# Patient Record
Sex: Female | Born: 1970 | Race: White | Hispanic: No | Smoking: Never smoker
Health system: Southern US, Community
[De-identification: ages and names within clinical notes are randomized; demographics above are authoritative.]

## PROBLEM LIST (undated history)

## (undated) DIAGNOSIS — E119 Type 2 diabetes mellitus without complications: Secondary | ICD-10-CM

## (undated) DIAGNOSIS — E039 Hypothyroidism, unspecified: Secondary | ICD-10-CM

## (undated) HISTORY — DX: Hypothyroidism, unspecified: E03.9

## (undated) HISTORY — PX: TOTAL ABDOMINAL HYSTERECTOMY W/ BILATERAL SALPINGOOPHORECTOMY: SHX83

## (undated) HISTORY — PX: OTHER SURGICAL HISTORY: SHX169

## (undated) HISTORY — DX: Type 2 diabetes mellitus without complications: E11.9

## (undated) HISTORY — PX: TONSILLECTOMY: SUR1361

## (undated) HISTORY — PX: LARYNX SURGERY: SHX692

## (undated) HISTORY — PX: CHOLECYSTECTOMY: SHX55

---

## 1997-09-28 ENCOUNTER — Inpatient Hospital Stay (HOSPITAL_COMMUNITY): Admission: AD | Admit: 1997-09-28 | Discharge: 1997-09-28 | Payer: Self-pay | Admitting: Obstetrics and Gynecology

## 1997-10-11 ENCOUNTER — Inpatient Hospital Stay (HOSPITAL_COMMUNITY): Admission: AD | Admit: 1997-10-11 | Discharge: 1997-10-14 | Payer: Self-pay | Admitting: Obstetrics and Gynecology

## 1998-09-30 ENCOUNTER — Encounter: Admission: RE | Admit: 1998-09-30 | Discharge: 1998-10-24 | Payer: Self-pay | Admitting: *Deleted

## 1999-05-26 ENCOUNTER — Inpatient Hospital Stay (HOSPITAL_COMMUNITY): Admission: AD | Admit: 1999-05-26 | Discharge: 1999-05-26 | Payer: Self-pay | Admitting: *Deleted

## 1999-05-27 ENCOUNTER — Encounter: Payer: Self-pay | Admitting: *Deleted

## 2000-08-06 ENCOUNTER — Ambulatory Visit (HOSPITAL_COMMUNITY): Admission: RE | Admit: 2000-08-06 | Discharge: 2000-08-06 | Payer: Self-pay | Admitting: Obstetrics and Gynecology

## 2001-10-22 ENCOUNTER — Emergency Department (HOSPITAL_COMMUNITY): Admission: EM | Admit: 2001-10-22 | Discharge: 2001-10-22 | Payer: Self-pay | Admitting: Internal Medicine

## 2002-01-19 ENCOUNTER — Ambulatory Visit (HOSPITAL_COMMUNITY): Admission: RE | Admit: 2002-01-19 | Discharge: 2002-01-19 | Payer: Self-pay | Admitting: Family Medicine

## 2002-01-19 ENCOUNTER — Encounter: Payer: Self-pay | Admitting: Family Medicine

## 2002-08-10 ENCOUNTER — Inpatient Hospital Stay (HOSPITAL_COMMUNITY): Admission: RE | Admit: 2002-08-10 | Discharge: 2002-08-13 | Payer: Self-pay | Admitting: Obstetrics & Gynecology

## 2004-08-18 ENCOUNTER — Ambulatory Visit (HOSPITAL_COMMUNITY): Admission: RE | Admit: 2004-08-18 | Discharge: 2004-08-18 | Payer: Self-pay | Admitting: Family Medicine

## 2004-08-21 ENCOUNTER — Encounter (HOSPITAL_COMMUNITY): Admission: RE | Admit: 2004-08-21 | Discharge: 2004-09-20 | Payer: Self-pay | Admitting: Family Medicine

## 2006-02-26 ENCOUNTER — Ambulatory Visit: Payer: Self-pay | Admitting: Internal Medicine

## 2007-03-06 ENCOUNTER — Emergency Department (HOSPITAL_COMMUNITY): Admission: EM | Admit: 2007-03-06 | Discharge: 2007-03-06 | Payer: Self-pay | Admitting: Family Medicine

## 2007-04-03 ENCOUNTER — Emergency Department (HOSPITAL_COMMUNITY): Admission: EM | Admit: 2007-04-03 | Discharge: 2007-04-03 | Payer: Self-pay | Admitting: Family Medicine

## 2007-10-06 ENCOUNTER — Encounter (INDEPENDENT_AMBULATORY_CARE_PROVIDER_SITE_OTHER): Payer: Self-pay | Admitting: Internal Medicine

## 2007-10-06 ENCOUNTER — Inpatient Hospital Stay (HOSPITAL_COMMUNITY): Admission: EM | Admit: 2007-10-06 | Discharge: 2007-10-07 | Payer: Self-pay | Admitting: Emergency Medicine

## 2007-10-20 ENCOUNTER — Encounter (HOSPITAL_COMMUNITY): Admission: RE | Admit: 2007-10-20 | Discharge: 2007-11-19 | Payer: Self-pay | Admitting: Internal Medicine

## 2007-12-23 ENCOUNTER — Ambulatory Visit (HOSPITAL_COMMUNITY): Admission: RE | Admit: 2007-12-23 | Discharge: 2007-12-24 | Payer: Self-pay | Admitting: *Deleted

## 2007-12-23 ENCOUNTER — Encounter (INDEPENDENT_AMBULATORY_CARE_PROVIDER_SITE_OTHER): Payer: Self-pay | Admitting: *Deleted

## 2007-12-27 ENCOUNTER — Inpatient Hospital Stay (HOSPITAL_COMMUNITY): Admission: EM | Admit: 2007-12-27 | Discharge: 2007-12-29 | Payer: Self-pay | Admitting: Emergency Medicine

## 2008-02-09 ENCOUNTER — Ambulatory Visit (HOSPITAL_COMMUNITY): Admission: RE | Admit: 2008-02-09 | Discharge: 2008-02-09 | Payer: Self-pay | Admitting: Gastroenterology

## 2008-02-14 ENCOUNTER — Ambulatory Visit (HOSPITAL_COMMUNITY): Admission: RE | Admit: 2008-02-14 | Discharge: 2008-02-14 | Payer: Self-pay | Admitting: Gastroenterology

## 2009-01-10 ENCOUNTER — Ambulatory Visit (HOSPITAL_COMMUNITY): Admission: RE | Admit: 2009-01-10 | Discharge: 2009-01-10 | Payer: Self-pay | Admitting: Internal Medicine

## 2009-01-14 ENCOUNTER — Encounter (INDEPENDENT_AMBULATORY_CARE_PROVIDER_SITE_OTHER): Payer: Self-pay | Admitting: Otolaryngology

## 2009-01-14 ENCOUNTER — Inpatient Hospital Stay (HOSPITAL_COMMUNITY): Admission: RE | Admit: 2009-01-14 | Discharge: 2009-01-24 | Payer: Self-pay | Admitting: Otolaryngology

## 2009-01-21 ENCOUNTER — Encounter (INDEPENDENT_AMBULATORY_CARE_PROVIDER_SITE_OTHER): Payer: Self-pay | Admitting: Otolaryngology

## 2009-02-08 ENCOUNTER — Ambulatory Visit: Payer: Self-pay | Admitting: Pulmonary Disease

## 2009-02-08 ENCOUNTER — Encounter (INDEPENDENT_AMBULATORY_CARE_PROVIDER_SITE_OTHER): Payer: Self-pay | Admitting: Otolaryngology

## 2009-02-08 ENCOUNTER — Inpatient Hospital Stay (HOSPITAL_COMMUNITY): Admission: RE | Admit: 2009-02-08 | Discharge: 2009-02-10 | Payer: Self-pay | Admitting: Otolaryngology

## 2009-06-08 DIAGNOSIS — E119 Type 2 diabetes mellitus without complications: Secondary | ICD-10-CM

## 2009-06-08 HISTORY — DX: Type 2 diabetes mellitus without complications: E11.9

## 2009-07-18 ENCOUNTER — Emergency Department (HOSPITAL_COMMUNITY): Admission: EM | Admit: 2009-07-18 | Discharge: 2009-07-18 | Payer: Self-pay | Admitting: Emergency Medicine

## 2009-07-28 IMAGING — NM NM LIVER FUNCTION STUDY
1 series · 6 of 6 positions shown · non-contrast
Comparison: None

CLINICAL DATA: Severe left-sided abdominal pain and nausea status
post cholecystectomy.  Evaluate for biliary leak.

NUCLEAR MEDICINE HEPATOBILIARY IMAGING
TECHNIQUE: Sequential images of the abdomen were obtained [DATE] minutes following intravenous administration of
radiopharmaceutical. Additional delayed images were also obtained
with a wide field of view camera.
Radiopharmaceutical:  P.PmNi Pc-PPm Choletec

[hida · 2.40mm/px · 6 of 10 frames shown]
[frame 1/10]
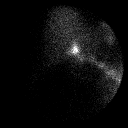
[frame 3/10]
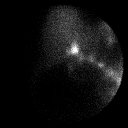
[frame 5/10]
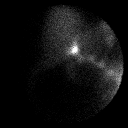
[frame 6/10]
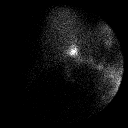
[frame 8/10]
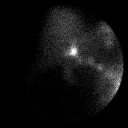
[frame 10/10]
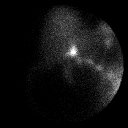

[6 of 6 positions shown; findings below may reference images not displayed]

FINDINGS: Prompt radiopharmaceutical uptake by the liver is seen.
There is also prompt biliary excretion of activity.

Excreted biliary activity does not follow the course of the common
bile duct into the duodenum.  The biliary activity traverses from
the porta hepatis into the left abdomen and pelvis, where it
accumulates in the perisplenic space and left pericolic gutter.
This is consistent with a large postop biliary leak.
IMPRESSION: Large postop bile leak, with all bile accumulating within the left
perisplenic space and left pericolic gutter.

These findings were discussed with Dr. Jusban by telephone at 5394
hours.  The patient was returned to the emergency room.

## 2009-10-17 ENCOUNTER — Ambulatory Visit (HOSPITAL_COMMUNITY): Admission: RE | Admit: 2009-10-17 | Discharge: 2009-10-17 | Payer: Self-pay | Admitting: Internal Medicine

## 2009-10-30 ENCOUNTER — Encounter (INDEPENDENT_AMBULATORY_CARE_PROVIDER_SITE_OTHER): Payer: Self-pay | Admitting: *Deleted

## 2010-03-28 ENCOUNTER — Emergency Department (HOSPITAL_COMMUNITY): Admission: EM | Admit: 2010-03-28 | Discharge: 2010-03-28 | Payer: Self-pay | Admitting: Emergency Medicine

## 2010-04-12 ENCOUNTER — Emergency Department (HOSPITAL_COMMUNITY): Admission: EM | Admit: 2010-04-12 | Discharge: 2010-04-12 | Payer: Self-pay | Admitting: Emergency Medicine

## 2010-06-06 ENCOUNTER — Emergency Department (HOSPITAL_COMMUNITY)
Admission: EM | Admit: 2010-06-06 | Discharge: 2010-06-06 | Payer: Self-pay | Source: Home / Self Care | Admitting: Emergency Medicine

## 2010-06-29 ENCOUNTER — Encounter: Payer: Self-pay | Admitting: Family Medicine

## 2010-07-08 NOTE — Letter (Signed)
Summary: Unable to Reach, Consult Scheduled  [facility]  [address]   [place], [place] [address]   Phone: [phone]  Fax: [phone]    10/30/2009  [facility] [address] [place], [place]  [address] 1971/04/30   Dear Ms. Yaney,   We have been unable to reach you by phone.  Please contact our office with an updated phone number.  At the recommendation of DR [facility] we have been asked to schedule you a consult with DR Jena Gauss for ABDOMINAL PAIN .  Please call our office at [phone].     Thank you,    Diana Eves  [facility] R. Roetta Sessions, M.D.    Jonette Eva, M.D. Lorenza Burton, FNP-BC    Tana Coast, PA-C Phone: [phone]    Fax: [phone]

## 2010-08-12 IMAGING — CT CT NECK W/ CM
3 of 4 series · 13 of 33 positions shown, 16 images · IV contrast (Omnipaque 300)
Comparison: None.

CLINICAL DATA: Dysphasia.  Goiter.

CT NECK WITH CONTRAST
TECHNIQUE: Multidetector CT imaging of the neck was performed with
intravenous contrast.
Contrast: 100 ml Omnipaque-MYY.

[Series 2: soft tissue neck 3.0 b31s · axial · 0.32mm/px · z∈[+988,+1136]mm · 5 of 69 slices shown, 7 images]
[im 13/69  soft-tissue]
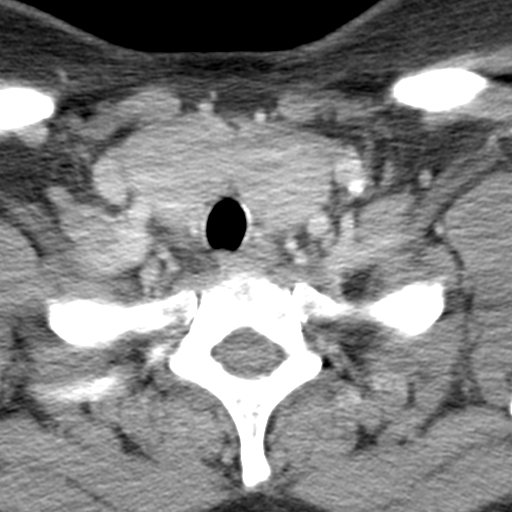
[im 13/69  bone]
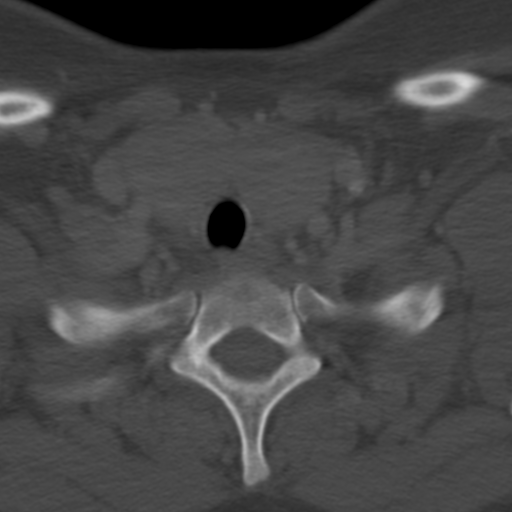
[im 25/69  bone]
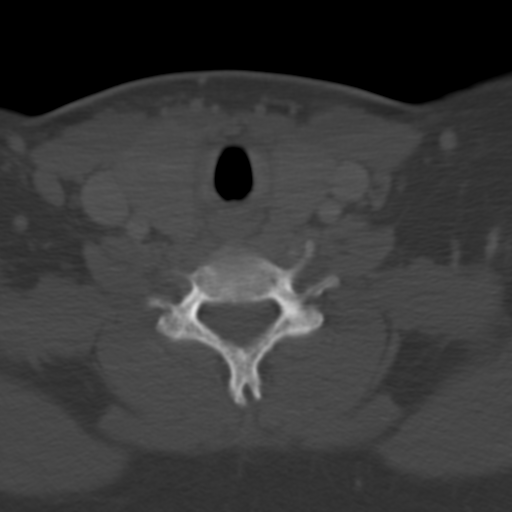
[im 38/69  bone]
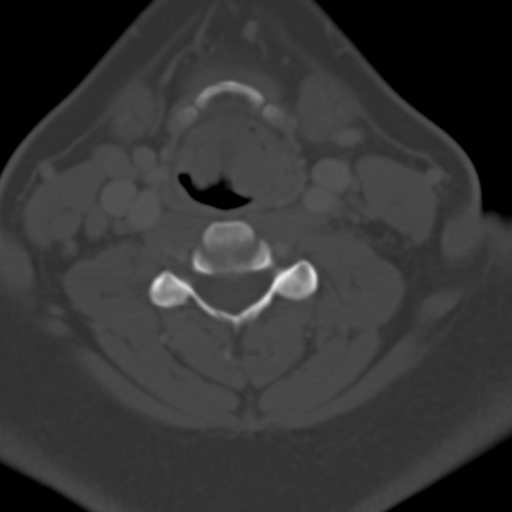
[im 50/69  bone]
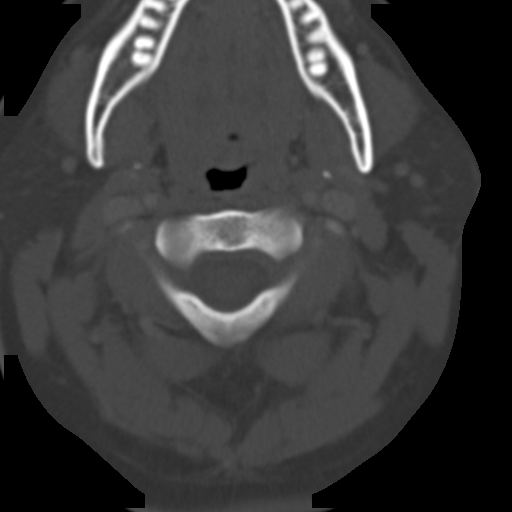
[im 62/69  soft-tissue]
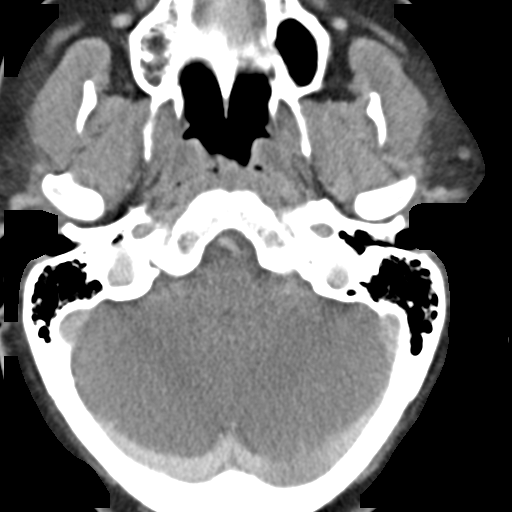
[im 62/69  bone]
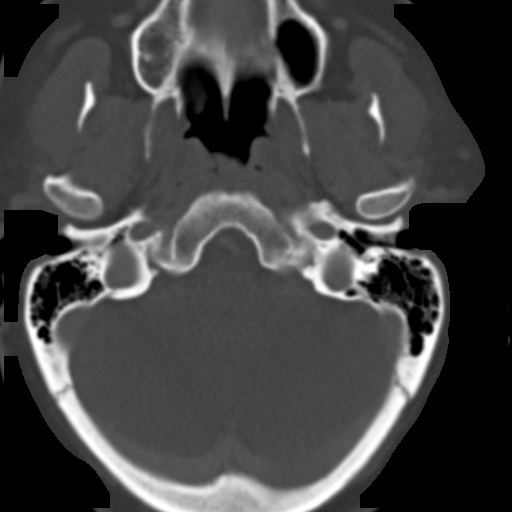

[Series 4: neck 3.0 soft tissue coronal · coronal · 0.30mm/px · 3 of 48 slices shown]
[im 10/48  bone]
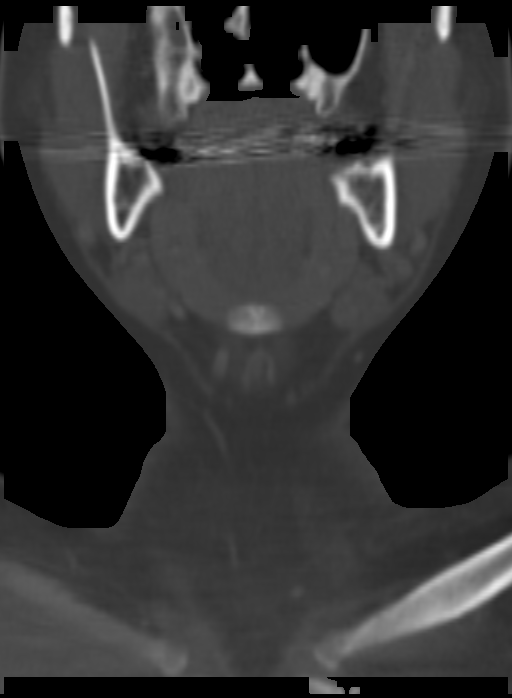
[im 19/48  bone]
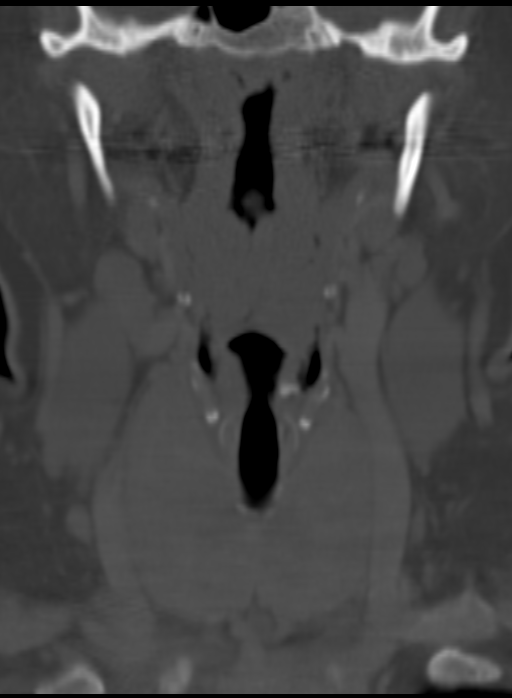
[im 29/48  bone]
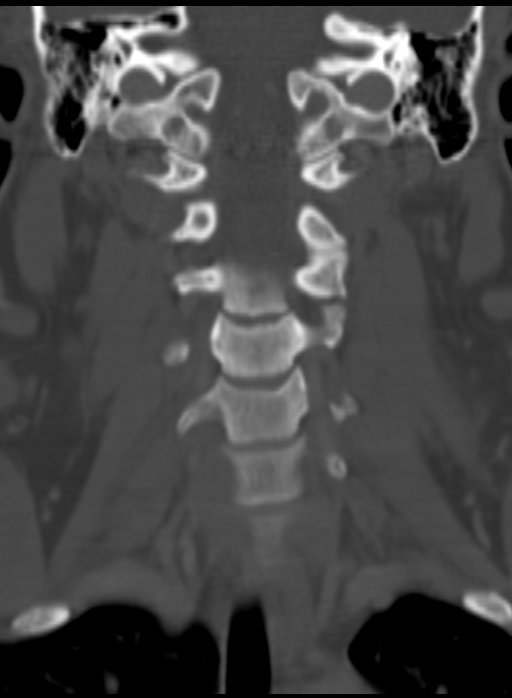

[Series 5: neck 3.0 soft tissue sag · sagittal · 0.33mm/px · 5 of 53 slices shown, 6 images]
[im 18/53  bone]
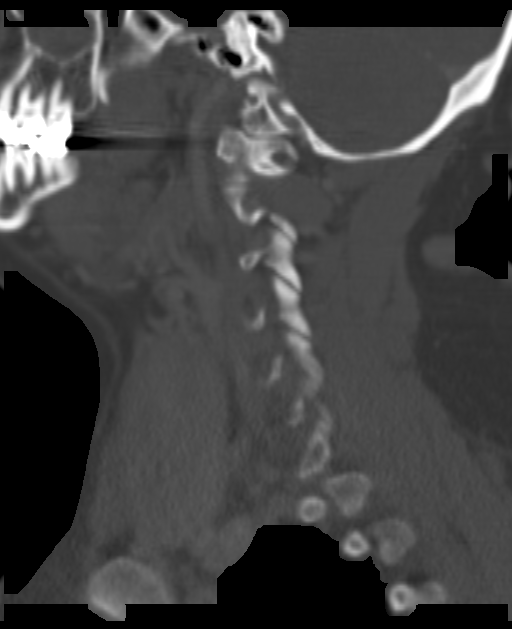
[im 22/53  bone]
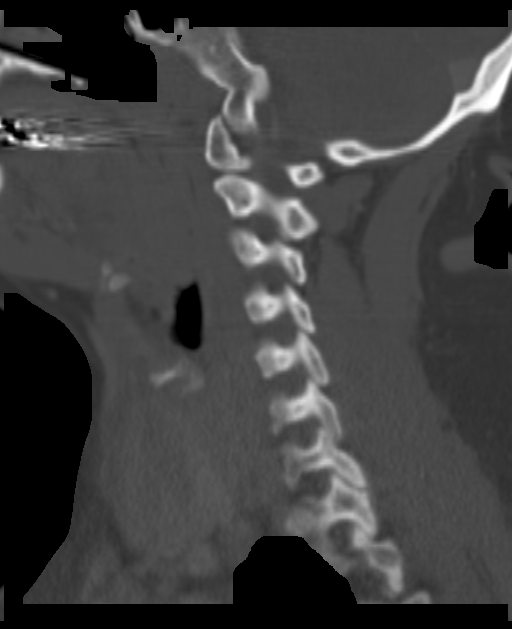
[im 27/53  soft-tissue]
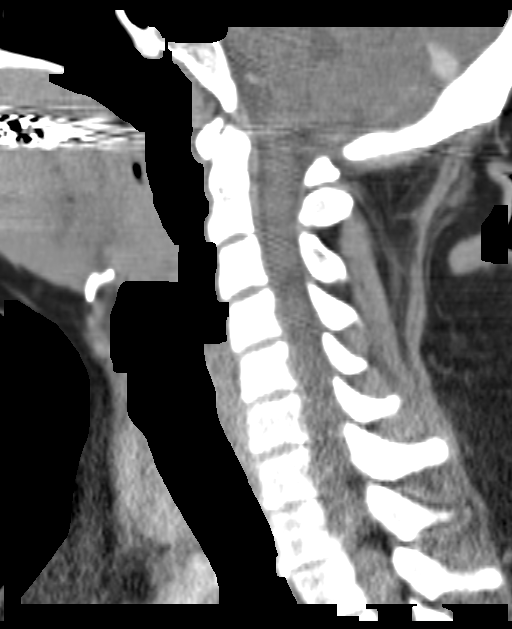
[im 27/53  bone]
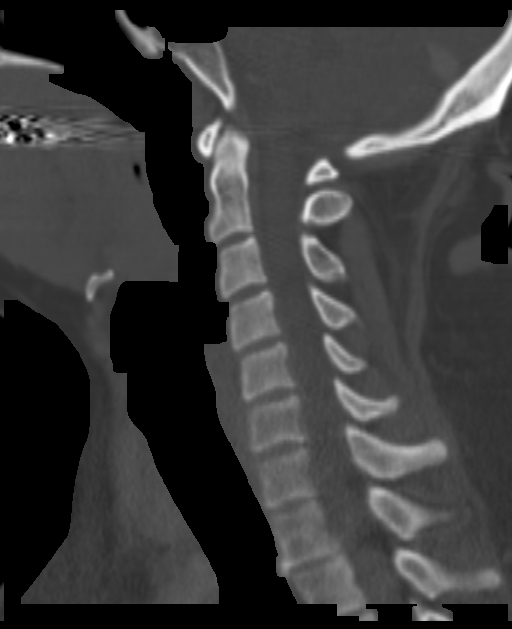
[im 31/53  bone]
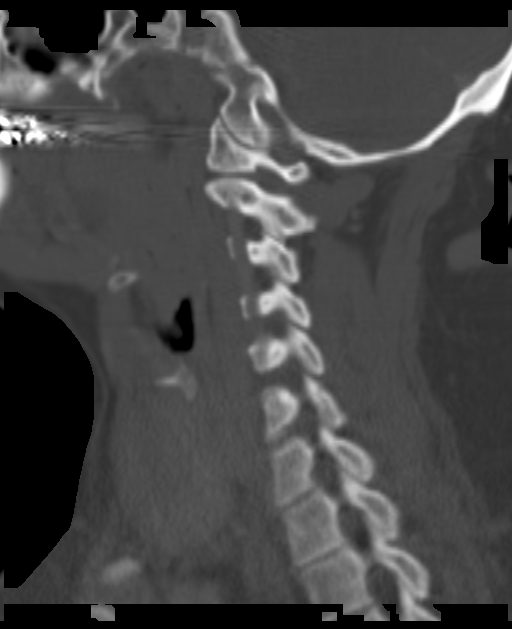
[im 35/53  bone]
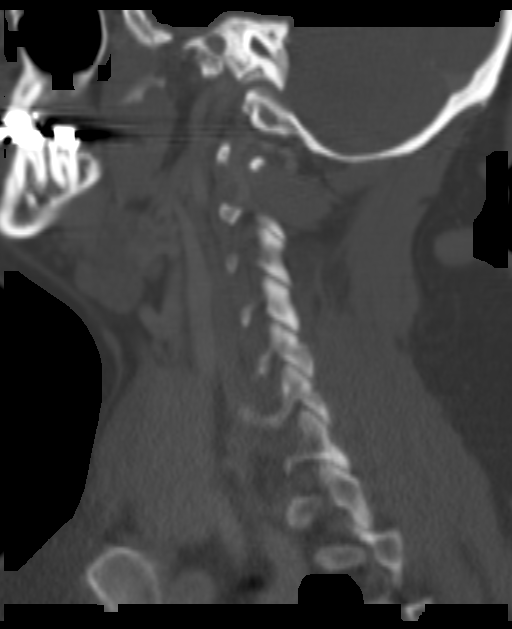

[13 of 33 positions shown; findings below may reference images not displayed]

FINDINGS: Mass in the vallecula region.  It is possible this
represents enlargement of the tonsillar lymphoid tissue (lymphoma)
although primary mass with extension into the superior aspect of
the aryepiglottic folds (greater on the left) is also a
consideration.  This causes narrowing of the airway.

Scattered increased number of normal size to slightly enlarged
lymph nodes throughout the neck.  This includes lymph node
posterior to the submandibular gland bilaterally measuring 1.9 x
0.9 cm on the left spanning over 1.9 cm in length and 1.8 x 1.2 cm
on the right spanning over 2.3 cm length.  Additionally, right
posterior triangle lymph node appears slightly rounded measuring
1.1 x 1.3 cm and spanning over 2.1 cm length.  Lower level IV lymph
nodes, larger on the left, measure up to 1.5 x 0.9 cm.

Slight fullness posterior superior nasopharyngeal region slightly
larger on the right.  This may represent coaptation membranes
although direct visualization would be necessary to exclude mucosal
abnormality.

Opacification right maxillary sinus.

Enlarged slightly heterogeneous thyroid gland can be further
evaluated with ultrasound. This causes minimal narrowing of the
airway.
IMPRESSION: Tongue base/vallecular region mass and surrounding slightly
prominent sized lymph nodes as described above.  This causes
narrowing of the airway.  Malignancy is a possibility.

Enlarged thyroid gland, ultrasound can be obtained for further
delineation.

Opacification right mastoid air cells.

Critical test results telephoned to Dr. Ami Ne at the time of
interpretation on 01/10/2009 at [DATE] a.m.

## 2010-09-07 ENCOUNTER — Emergency Department (HOSPITAL_COMMUNITY)
Admission: EM | Admit: 2010-09-07 | Discharge: 2010-09-08 | Disposition: A | Discharge status: Home / Self Care | Payer: Self-pay | Attending: Emergency Medicine | Admitting: Emergency Medicine

## 2010-09-07 ENCOUNTER — Inpatient Hospital Stay (INDEPENDENT_AMBULATORY_CARE_PROVIDER_SITE_OTHER)
Admission: RE | Admit: 2010-09-07 | Discharge: 2010-09-07 | Disposition: A | Discharge status: Home / Self Care | Payer: Self-pay | Source: Ambulatory Visit | Attending: Family Medicine | Admitting: Family Medicine

## 2010-09-07 DIAGNOSIS — R631 Polydipsia: Secondary | ICD-10-CM | POA: Insufficient documentation

## 2010-09-07 DIAGNOSIS — R3589 Other polyuria: Secondary | ICD-10-CM | POA: Insufficient documentation

## 2010-09-07 DIAGNOSIS — R634 Abnormal weight loss: Secondary | ICD-10-CM | POA: Insufficient documentation

## 2010-09-07 DIAGNOSIS — E669 Obesity, unspecified: Secondary | ICD-10-CM | POA: Insufficient documentation

## 2010-09-07 DIAGNOSIS — R358 Other polyuria: Secondary | ICD-10-CM | POA: Insufficient documentation

## 2010-09-07 DIAGNOSIS — R000 Tachycardia, unspecified: Secondary | ICD-10-CM | POA: Insufficient documentation

## 2010-09-07 DIAGNOSIS — E119 Type 2 diabetes mellitus without complications: Secondary | ICD-10-CM | POA: Insufficient documentation

## 2010-09-07 DIAGNOSIS — E039 Hypothyroidism, unspecified: Secondary | ICD-10-CM | POA: Insufficient documentation

## 2010-09-07 DIAGNOSIS — R5381 Other malaise: Secondary | ICD-10-CM | POA: Insufficient documentation

## 2010-09-07 LAB — POCT URINALYSIS DIP (DEVICE)
Nitrite: NEGATIVE
Specific Gravity, Urine: 1.01 (ref 1.005–1.030)

## 2010-09-07 LAB — POCT I-STAT, CHEM 8
BUN: 9 mg/dL (ref 6–23)
Chloride: 100 mEq/L (ref 96–112)
Glucose, Bld: 456 mg/dL — ABNORMAL HIGH (ref 70–99)
HCT: 48 % — ABNORMAL HIGH (ref 36.0–46.0)
Hemoglobin: 16.3 g/dL — ABNORMAL HIGH (ref 12.0–15.0)
Potassium: 4 mEq/L (ref 3.5–5.1)
Sodium: 136 mEq/L (ref 135–145)
TCO2: 25 mmol/L (ref 0–100)

## 2010-09-08 LAB — URINE MICROSCOPIC-ADD ON

## 2010-09-08 LAB — URINALYSIS, ROUTINE W REFLEX MICROSCOPIC
Bilirubin Urine: NEGATIVE
Glucose, UA: >1000 mg/dL — AB
Hgb urine dipstick: NEGATIVE
Ketones, ur: 15 mg/dL — AB
Specific Gravity, Urine: >1.046 — ABNORMAL HIGH (ref 1.005–1.030)

## 2010-09-08 LAB — BASIC METABOLIC PANEL
BUN: 8 mg/dL (ref 6–23)
CO2: 24 mEq/L (ref 19–32)
Calcium: 9.1 mg/dL (ref 8.4–10.5)
Chloride: 97 mEq/L (ref 96–112)
Creatinine, Ser: 0.8 mg/dL (ref 0.4–1.2)
GFR calc non Af Amer: >60 mL/min (ref >60)
Glucose, Bld: 361 mg/dL — ABNORMAL HIGH (ref 70–99)
Potassium: 3.5 mEq/L (ref 3.5–5.1)

## 2010-09-08 LAB — DIFFERENTIAL
Basophils Absolute: 0.1 10*3/uL (ref 0.0–0.1)
Monocytes Absolute: 0.9 10*3/uL (ref 0.1–1.0)
Neutro Abs: 4.5 10*3/uL (ref 1.7–7.7)

## 2010-09-08 LAB — CBC
HCT: 43.8 % (ref 36.0–46.0)
Platelets: 256 10*3/uL (ref 150–400)
RBC: 5.46 MIL/uL — ABNORMAL HIGH (ref 3.87–5.11)
RDW: 13 % (ref 11.5–15.5)

## 2010-09-08 LAB — GLUCOSE, CAPILLARY: Glucose-Capillary: 294 mg/dL — ABNORMAL HIGH (ref 70–99)

## 2010-09-12 LAB — POCT I-STAT 3, ART BLOOD GAS (G3+)
TCO2: 27 mmol/L (ref 0–100)
pCO2 arterial: 41.7 mmHg (ref 35.0–45.0)
pH, Arterial: 7.404 — ABNORMAL HIGH (ref 7.350–7.400)

## 2010-09-12 LAB — CBC
HCT: 29.6 % — ABNORMAL LOW (ref 36.0–46.0)
HCT: 33.5 % — ABNORMAL LOW (ref 36.0–46.0)
Hemoglobin: 11.5 g/dL — ABNORMAL LOW (ref 12.0–15.0)
Hemoglobin: 12.7 g/dL (ref 12.0–15.0)
MCV: 84.5 fL (ref 78.0–100.0)
MCV: 84.6 fL (ref 78.0–100.0)
Platelets: 175 10*3/uL (ref 150–400)
Platelets: 240 10*3/uL (ref 150–400)
RBC: 4.45 MIL/uL (ref 3.87–5.11)
RDW: 13.2 % (ref 11.5–15.5)
RDW: 13.3 % (ref 11.5–15.5)
RDW: 13.5 % (ref 11.5–15.5)
WBC: 11.8 10*3/uL — ABNORMAL HIGH (ref 4.0–10.5)
WBC: 9.3 10*3/uL (ref 4.0–10.5)

## 2010-09-12 LAB — BASIC METABOLIC PANEL
BUN: 9 mg/dL (ref 6–23)
CO2: 29 mEq/L (ref 19–32)
Chloride: 106 mEq/L (ref 96–112)
GFR calc non Af Amer: >60 mL/min (ref >60)
Glucose, Bld: 133 mg/dL — ABNORMAL HIGH (ref 70–99)
Glucose, Bld: 95 mg/dL (ref 70–99)
Potassium: 3.7 mEq/L (ref 3.5–5.1)
Potassium: 4.3 mEq/L (ref 3.5–5.1)
Sodium: 137 mEq/L (ref 135–145)

## 2010-09-12 LAB — MRSA PCR SCREENING: MRSA by PCR: NEGATIVE

## 2010-09-13 LAB — CBC
MCV: 84.2 fL (ref 78.0–100.0)
Platelets: 219 10*3/uL (ref 150–400)
RDW: 13.1 % (ref 11.5–15.5)
WBC: 7.6 10*3/uL (ref 4.0–10.5)

## 2010-09-13 LAB — GLUCOSE, CAPILLARY: Glucose-Capillary: 63 mg/dL — ABNORMAL LOW (ref 70–99)

## 2010-09-13 LAB — BASIC METABOLIC PANEL
BUN: 9 mg/dL (ref 6–23)
Creatinine, Ser: 0.74 mg/dL (ref 0.4–1.2)
GFR calc non Af Amer: >60 mL/min (ref >60)

## 2010-09-13 LAB — ANTI-THYROGLOBULIN ANTIBODY: Thyroglobulin Ab: 396.2 U/mL — ABNORMAL HIGH (ref 0.0–60.0)

## 2010-10-21 NOTE — H&P (Signed)
[name], HARDIE           ACCOUNT NO.:  [id number]   MEDICAL RECORD NO.:  [id number]          PATIENT TYPE:  INP   LOCATION:  4733                         FACILITY:  [facility]   PHYSICIAN:  Hind I Elsaid, MD      DATE OF BIRTH:  [birth year]   DATE OF ADMISSION:  10/06/2007  DATE OF DISCHARGE:                              HISTORY & PHYSICAL   CHIEF COMPLAINT:  Chest pain.   HISTORY OF PRESENT ILLNESS:  This is a 40 year old female with a history  of hypothyroidism, obesity, admitted to the hospital while she was  sitting on the computer.  She suddenly started feeling sharp chest pain.  The pain was sharp in nature 7/10, nonradiating.  It is mainly  retrosternal, associated with sweating and palpitation at that time.  The pain did never go away.  The patient is still complaining of chest  pain, but the pain is less and severity at this time is 5/10.  The pain  is not related to breathing, but according to the patient it is getting  worse when she lie on half left shoulder.  The patient denies any  similar problem in the past, denies any history of any cardiac workup in  the past.  The patient denies any shortness of breath associated with  the chest pain.  The patient also complained of right upper quadrant  abdominal pain.  Condition associated with nausea, but no vomiting, and  no change in bowel habit.   PAST MEDICAL HISTORY:  1. Hypothyroidism.  2. Status post tonsillectomy.  3. Status post hysterectomy.   MEDICATION:  Synthroid 50 mcg p.o. daily.   ALLERGY:  SEA FOOD.   SOCIAL HISTORY:  The patient lives with her husband and has 3 children.  She works as a Engineer, civil (consulting) at [employer].  The patient denies any smoking,  denies any alcohol, or IV drug abuse.   FAMILY HISTORY:  Her father has a history of hypertension.  Mother has a  history of hypertension.  Grandmother died with a history of heart  attack.  Grandmother had a history of heart problem, status post  angioplasty.   PHYSICAL EXAMINATION:  GENERAL:  The patient lying comfortably in bed,  not in respiratory distress or shortness of breath.  VITAL SIGNS:  Temperature 98.7, blood pressure 120/78, respirations 18,  saturation 97% on room air.  HEENT:  Normocephalic, atraumatic.  Pupils equal, round, reactive to  light and accommodation, extraocular muscle movement within normal.  NECK:  Supple, no JVD, and no lymphadenopathy.  HEART:  S1 and S2 with no added.  LUNGS:  Normal vesicular breathing with equal air entry.  ABDOMEN:  Soft and mild tenderness at right upper quadrant, but Murphy  sign negative.  No rebound and no guarding.  EXTREMITIES:  Peripheral pulse is intact and no lower limb edema.  CNS:  The patient alert and oriented x3.  There is no focal neurological  findings.   Blood workup and cardiac markers are negative.   CBC:  White blood cells at 0.8, hemoglobin 13.4, hematocrit 38.6, and  platelet 273.  Another cardiac markers; troponin  increased to 0.07 and  CK-MB to 1.8.  Liver function tests:  ALT 45, AST 61, alkaline  phosphatase 62.  Total protein 7.9, albumin 3.7, lipase 17 and D-dimer  is 0.27.  Chest x-ray showed borderline cardiomegaly, no active disease.   ASSESSMENT AND PLAN:  1. Chest pain, rule out acute coronary syndrome.  EKG, normal sinus      rhythm with right axis deviation.  admitted to telemetry to cycle      another cardiac enzyme as the troponin had become mildly elevated.      Repeat another EKG and place the patient on aspirin.  Get 2D echo,      chest pain looks atypical in nature.  2. Right upper quadrant abdominal pain with some tenderness on      palpation, rule out acute cholecystitis versus gallstone colic      pain.  We will get ultrasound of the liver and biliary system and      we will give the patient some pain medications.  3. Hypothyroidism.  We will continue her Synthroid, DVT, and GI      prophylaxis followed by recommendation to be  addressed in hospital      course.      Hind Bosie Helper, MD  Electronically Signed     HIE/MEDQ  D:  10/06/2007  T:  10/06/2007  Job:  [id number]

## 2010-10-21 NOTE — Consult Note (Signed)
NAMEJAHNIA, HEWES           ACCOUNT NO.:  [id number]   MEDICAL RECORD NO.:  [id number]          PATIENT TYPE:  INP   LOCATION:  1532                         FACILITY:  [facility]   PHYSICIAN:  John C. Madilyn Fireman, M.D.    DATE OF BIRTH:  July 17, 1970   DATE OF CONSULTATION:  12/27/2007  DATE OF DISCHARGE:                                 CONSULTATION   .   REASON FOR CONSULTATION:  Suspected bile duct injury related to recent  cholecystectomy.   HISTORY OF PRESENT ILLNESS:  The patient is a 40 year old white female  who underwent laparoscopic cholecystectomy on July 17.  She had a very  tiny cystic duct and Dr. Colin Benton said it was very difficult to place a  clips on it, but she would manage to place 3.  She was not able to tan  intraoperative cholangiogram.  Her abdominal ultrasound prior to that  did not show any gallstones. but her hepatobiliary scan showed an  abnormal ejection fraction at 6%.  She did fairly well postoperatively  until the night before admission when she developed nausea followed by  increasing diffuse abdominal pain with multiple episodes of nonbloody  emesis.  She presented to the emergency room today where she had normal  vital signs, was afebrile with a normal WBC count and normal liver  function tests with bilirubin of 1.1. A nuclear medicine hepatic biliary  scan showed prompt leakage of contrast into the left perisplenic space  in left paracolic gutter.  There was no outlining of the common bile  duct or duodenum, all felt consistent with free bile leak.  We were  consulted for a possible ERCP and stent placement.   PAST MEDICAL HISTORY:  1. Hypothyroidism.  2. Obesity.   SURGERIES:  1. Tonsillectomy.  2. Hysterectomy.  3. Cholecystectomy.   MEDICATIONS:  Synthroid.   ALLERGIES:  ALLERGY TO SEAFOOD.   SOCIAL HISTORY:  She lives with her husband 3 children.  She denies  alcohol or tobacco use.   FAMILY HISTORY:  Negative for gallbladder or liver  disease.   PHYSICAL EXAMINATION:  Obese white female, pleasant and in moderate  distress from abdominal pain.  HEART:  Regular rate and rhythm without murmurs.  LUNGS:  Clear.  ABDOMEN:  Diffusely tender with mild diffuse rebound.   LABORATORY DATA:  ALT 61.  Bilirubin, AST and alkaline phosphatase are  normal.  WBC count 9400, hemoglobin 14.1.   IMPRESSION:  Suspected bile leak related to recent cholecystectomy.   PLAN:  We will proceed with endoscopic retrograde  cholangiopancreatography for confirmation of diagnosis and bile duct  stenting if feasible.  Risks, rationale and alternatives were explained  the patient and she wishes to proceed.   Simona Huh           ______________________________  Everardo All Madilyn Fireman, M.D.     JCH/MEDQ  D:  12/27/2007  T:  12/27/2007  Job:  [id number]   cc:   Alfonse Ras, MD  [address]  [place]  [place] [address]

## 2010-10-21 NOTE — Discharge Summary (Signed)
[name], [name]           ACCOUNT NO.:  [id number]   MEDICAL RECORD NO.:  [id number]          PATIENT TYPE:  INP   LOCATION:  4733                         FACILITY:  [facility]   PHYSICIAN:  Isidor Holts, M.D.  DATE OF BIRTH:  1970/10/08   DATE OF ADMISSION:  10/06/2007  DATE OF DISCHARGE:  10/07/2007                               DISCHARGE SUMMARY   PRIMARY MEDICAL PHYSICIAN:  Unassigned.   DISCHARGE DIAGNOSES:  1. Atypical chest pain.  2. Nonspecific abdominal pain/possible gastroesophageal reflux      disease.  3. Dysthyroidism  4. History of depression.   DISCHARGE MEDICATIONS:  1. Aspirin 81 mg p.o. daily.  2. Prilosec OTC 20 mg p.o. daily.  3. Toprol XL 12.5 mg p.o. daily.  4. Synthroid 50 mcg p.o. daily.   PROCEDURES:  1. Chest x-ray dated 10/06/2007, this showed borderline cardiomegaly,      no active disease  2. Abdominal ultrasound scan dated 10/06/2007, this showed normal      appearance of the gallbladder and bile ducts, no acute abdominal      pathology seen.  There is borderline splenic size.  Diffuse      increased echogenicity of the hepatic parenchyma is seen.  This is      most commonly associated with fatty infiltration of the liver.  3. 2-D echocardiogram dated October 06, 2007, this showed overall normal      left ventricular systolic function, EF 55-60%, no left ventricular      regional wall motion abnormalities.  Aortic valve thickness was      mildly increased.  There was mild mitral valvular regurgitation.   CONSULTATIONS:  Dr. Yates Decamp, [facility].   ADMISSION HISTORY:  As in H&P notes of 10/06/2007, dictated by Dr. Andreas Newport  I Elsaid.  However in brief, this is a 40 year old female, with known  history of hypothyroidism, depression, status post tonsillectomy, status  post hysterectomy, who presents with central sharp chest pain which  commenced while she was sitting at the computer.  There was also a  suggestion of right  upper quadrant abdominal pain.  The patient was  admitted for further evaluation, investigation and management.   CLINICAL COURSE:  1. Chest pain.  This was atypical in presentation and character.  The      patient has no obvious coronary artery disease risk factors, apart      from morbid obesity.  Chest x-ray was negative for acute pathology.      Cardiac enzymes were cycled and remained elevated.  A 2-D      echocardiogram showed normal LV function.  No regional wall motion      abnormalities.  D-dimer was normal at 0.22, effectively ruling out      pulmonary embolism.  Cardiac consultation was called, which was      kindly provided by Dr. Yates Decamp, who has recommended a 2-day      outpatient stress Myoview, which his office will arrange.  The      patient was meanwhile commenced on low-dose Aspirin, low-dose beta-      blocker, which she  is supposed to continue until stress test is      done.   1. Abdominal pain.  There was some suggestion of right upper quadrant      abdominal pain at the time of presentation.  The patient underwent      abdominal ultrasound scan on 10/06/2007, which showed no acute      pathology.  Lipase level was normal at 17.  LFTs were normal, with      an AST of 29, ALT 44, alkaline phosphatase 62.  She was placed on      proton pump inhibitor.  It is likely that the patient may have      GERD.  There was some suggestion of fatty infiltration of the liver      on ultrasound.  The patient has been recommended to lose weight.   1. Dysthyroidism.  The patient has a known history of hypothyroidism,      and is on thyroid replacement therapy, which was continued during      the course of this hospitalization.  Of note, her lipid profile      showed the following findings: total cholesterol 139, triglycerides      72, HDL 28, LDL 97, i.e. excellent lipid profile.   1. Remote history of depression.  The patient is not on any      antidepressants, but mood  remained stable throughout the course of      this hospitalization.   DISPOSITION:  The patient was on 10/07/2007, considered clinically  stable for discharge, and was therefore, discharged accordingly,  particularly as she was asymptomatic.   DIET:  Heart-healthy.   ACTIVITY:  As tolerated   FOLLOW-UP INSTRUCTIONS:  The patient is recommended to follow up with  Dr. Yates Decamp, [facility].  Dr. Verl Dicker  office will call to schedule an appointment.  An outpatient 2-Day stress  test will be arranged by Dr. Verl Dicker office.      Isidor Holts, M.D.  Electronically Signed     CO/MEDQ  D:  10/07/2007  T:  10/07/2007  Job:  [id number]   cc:   Cristy Hilts. Jacinto Halim, MD

## 2010-10-21 NOTE — Op Note (Signed)
[name], HONE           ACCOUNT NO.:  [id number]   MEDICAL RECORD NO.:  [id number]          PATIENT TYPE:  INP   LOCATION:  0104                         [facility]ACILITY:  [facility]   PHYSICIAN:  John C. Madilyn Fireman, M.D.    DATE OF BIRTH:  07-22-1970   DATE OF PROCEDURE:  12/27/2007  DATE OF DISCHARGE:                               OPERATIVE REPORT   Endoscopic retrograde cholangiopancreatography.   INDICATIONS FOR PROCEDURE:  Suspected bile leak, confirmed by hepatic  biliary scan after a recent laparoscopic cholecystectomy.   PROCEDURE:  The patient was placed in the prone position and placed on  the pulse monitor with continuous low-flow oxygen delivered by nasal  cannula.  She was sedated with 150 mcg IV fentanyl and 16 mg IV Versed.  The Olympus side-viewing endoscope was advanced blindly into the  oropharynx, esophagus, and stomach.  The pylorus which appeared normal  was traversed, and the papilla of Vater located on the medial duodenal  wall had a very small intraduodenal segment, and no bile was seen to  exit from it during the procedure.  Attempts were made with the Montez Morita sphincterotome and the standard cannula to cannulate the common  bile duct, but repeated attempts were unsuccessful with repeated entry  of the wire on a few occasions, dye into a normal-appearing pancreatic  duct.  I placed a wire in the pancreatic duct and then tried to  recannulate with the wire in place but attempts only resulted in entry  into the pancreatic duct once more.  Finally, I used a taper tipped  smaller caliber sphincterotome, but likewise attempts were unsuccessful  and only resulted in entering the pancreatic duct.  At that point, the  procedure was terminated and the patient returned to the recovery room  in stable condition.  She tolerated the procedure well.  There were no  immediate complications.   IMPRESSION:  1. Normal pancreatic duct.  2. Unsuccessful attempt to cannulate  the common bile duct in the      interest of attempting to place a common bile duct stent.   PLAN:  Will discuss next step with Dr. Colin Benton and associates.           ______________________________  Everardo All Madilyn Fireman, M.D.     JCH/MEDQ  D:  12/27/2007  T:  12/27/2007  Job:  [id number]   cc:   Alfonse Ras, MD  [address]  [place]  [place] [address]

## 2010-10-21 NOTE — Op Note (Signed)
[name], [name]           ACCOUNT NO.:  [id number]   MEDICAL RECORD NO.:  [id number]          PATIENT TYPE:  INP   LOCATION:  2303                         FACILITY:  [facility]   PHYSICIAN:  Jefry H. Pollyann Kennedy, MD     DATE OF BIRTH:  August 25, 1970   DATE OF PROCEDURE:  01/14/2009  DATE OF DISCHARGE:                               OPERATIVE REPORT   PREOPERATIVE DIAGNOSES:  1. Vallecular mass.  2. Thyroid mass.  3. Partial airway obstruction.   POSTOPERATIVE DIAGNOSES:  1. Vallecular mass.  2. Thyroid mass.  3. Partial airway obstruction.   PROCEDURE:  1. Direct laryngoscopy with biopsy of vallecular mass.  2. Tracheostomy.   SURGEON:  Jefry H. Pollyann Kennedy, MD   ANESTHESIA:  General endotracheal anesthesia was used.  No  complications.   BLOOD LOSS:  Minimal.   FINDINGS:  1. A large mass involving the vallecula bilaterally.  There was no      surface ulceration.  Clinically appeared suspicious for lymphoma.  2. Very large significant enlargement of the thyroid with displacement      of the trachea to the left side.  Additional finding is initial      intubation was done successfully without problems.  It was      difficult and that the cords were impossible to be visualized      because of the mass in the vallecula and the fact this was having      on the overall airway configuration.   REFERRING PHYSICIAN:  Madelin Rear. Fusco, MD   HISTORY:  A 40 year old with a 3-week history of gradually worsening  difficulty with her voice and with breathing.  She was seen in the  office late last week and over the weekend she started to notice that  when she lies down flat, she feels as though she is not able to breathe  very well.  Risks, benefits, alternatives, complications of the  procedure were explained to the patient, seemed to understand and agreed  to surgery.   PROCEDURE:  The patient was taken to the operating room, placed in the  operating table in supine position.  Following  induction of intravenous  sedation and propofol anesthesia, the patient was mask ventilated.  There were no difficulties.  She was then given a short-acting paralytic  agent.  Her care was then turned over to myself to perform the  intubation.  She was intubated without difficulty with a standard curved  anesthesia laryngoscope.  I was able to visualize the epiglottis and was  able to lift up the epiglottis anteriorly and was able to place a #7  endotracheal tube without difficulty, although, I was not able to  visualize the cords because of the pressure effect of the vallecular  mass on the epiglottis.  The direct laryngoscopy was then performed.  I  was able to visualize the vallecular mass nicely and the above-mentioned  findings were noted.  I was unable to visualize the laryngeal introitus.  A Jako laryngoscope was used for this, but I was unable to see the  cords, or the glottis, or subglottis.  The vallecular mass was biopsied  on both sides in multiple places and sent for pathologic evaluation.  The specimen was sent fresh for lymphoma workup.  At this point,  decision was made to perform elective tracheostomy while the patient was  already intubated because of the difficulties described above and  because of the large goiter, and the difficulty that performing an  emergency tracheostomy would pose.  The neck was prepped and draped in  standard fashion.  A vertical midline incision was created just below  the sternal notch with electrocautery.  The superficial fascia and the  subcutaneous fat was divided.  The diastasis of the strap muscles was  divided and reflected laterally using [employer] retractor.  By  palpation, the trachea was significantly deviated to the left side.  The  thyroid isthmus was very thickened and was partially excised to expose  the upper trachea.  This was sent also for fresh and for lymphoma workup  to pathology.  The fascial tissue was cleaned off the face  of the  trachea.  Tracheotomy was created between what was felt to be the second  and third ring, and a lower tracheal ring flap was created, and secured  to the cervical skin with chromic suture.  Under direct visualization,  the endotracheal tube was removed and #8 Shiley cuffed trach tube was  placed without difficulty.  This was secured in place with Velcro straps  and 2-0 silk suture.  Dressing was applied.  The patient was then  transferred to recovery room in stable condition.      Jefry H. Pollyann Kennedy, MD  Electronically Signed     JHR/MEDQ  D:  01/14/2009  T:  01/15/2009  Job:  [id number]   cc:   Madelin Rear. Sherwood Gambler, MD

## 2010-10-21 NOTE — Op Note (Signed)
NAMESANDARA, Hale           ACCOUNT NO.:  [id number]   MEDICAL RECORD NO.:  [id number]          PATIENT TYPE:  INP   LOCATION:  1532                         FACILITY:  [facility]   PHYSICIAN:  Graylin Shiver, M.D.   DATE OF BIRTH:  [birth year]   DATE OF PROCEDURE:  12/28/2007  DATE OF DISCHARGE:                               OPERATIVE REPORT   PROCEDURES:  An endoscopic retrograde cholangiogram with sphincterotomy  and biliary stent placement.   INDICATION FOR PROCEDURE:  A 40 year old female status post laparoscopic  cholecystectomy for biliary dyskinesia with findings on recent HIDA scan  of a bile leak.  ERCP was attempted last night, but was unsuccessful for  cannulation of the biliary tree.  Repeat ERCP is being done today.   CONSENT:  Informed consent was obtained after explanation of the risks  of bleeding, infection, perforation and pancreatitis.   PREMEDICATION:  Fentanyl 125 mcg IV, Versed 12.5 mg IV, Phenergan 25 mg  IV, glucagon 1 mg IV, Robinul 0.2 mg IV.   PROCEDURE IN DETAIL:  With the patient lying on her abdomen on the  fluoroscopy table, the lateral viewing duodenoscope was inserted into  the oropharynx and passed into the esophagus.  It was advanced down the  esophagus, then into the stomach and then into the duodenum.  No obvious  lesions were seen in the stomach or the duodenum.  The papilla of Vater  was located.  It looked somewhat irritated.  Selective cannulation was  achieved initially 1 time of the pancreatic duct with a guidewire.  The  sphincterotome was then repositioned and selective cannulation was  achieved of the common bile duct with a guidewire.  The guidewire was  advanced up into the proximal biliary tree.  The catheter was inserted  and contrast was injected into the biliary tree.  With contrast in the  biliary tree, it was noted that there was extravasation of contrast  material around the region of the cystic duct. I also initially saw  what  appeared to possibly be a filling defect.  I was not sure if it was a  filling defect or an air bubble in the common bile duct.  A small  sphincterotomy was made for getting better access to the duct.  Then a  endoscopic balloon was advanced up the duct and inflated and the duct  was swept.  No stones were extracted.  Another injection was made of a  little contrast with the balloon inflated, and I saw no further filling  defects.  I believe the initial defect was that of an air bubble.  After  this, a biliary stent was placed.  I placed a 7 cm,  8.5 mm stent in the  bile duct.  She tolerated the procedure well, without immediate  complications.   IMPRESSION:  Bile leak with biliary stent placed.   PLAN:  I would recommend keeping the stent in place for 4 to 6 weeks.  We will then reassess things and if the leak has sealed, which it  should, we will remove the biliary stent as an outpatient.  ______________________________  Graylin Shiver, M.D.     SFG/MEDQ  D:  12/28/2007  T:  12/28/2007  Job:  [id number]   cc:   Alfonse Ras, MD  [address], [address]  [place]  [place] [address]

## 2010-10-21 NOTE — Op Note (Signed)
NAMEDESERAI, CANSLER           ACCOUNT NO.:  [id number]   MEDICAL RECORD NO.:  [id number]          PATIENT TYPE:  OIB   LOCATION:  1527                         FACILITY:  [facility]   PHYSICIAN:  Alfonse Ras, MD   DATE OF BIRTH:  [birth year]   DATE OF PROCEDURE:  DATE OF DISCHARGE:                               OPERATIVE REPORT   PREOPERATIVE DIAGNOSES:  Biliary dyskinesia.   POSTOPERATIVE DIAGNOSIS:  Biliary dyskinesia.   PROCEDURE:  Laparoscopic cholecystectomy with attempted intraoperative  cholangiogram.   SURGEON:  Baruch Merl, MD   ASSISTANT:  Blima Rich, RNFA.   ANESTHESIA:  General.   DESCRIPTION:  The patient was taken to the operating room, placed in  supine position.  After adequate general anesthesia was induced using  endotracheal tube, the abdomen was prepped and draped normal sterile  fashion.  Using a transverse infraumbilical incision, I dissected down  to the fascia.  Fascia was opened vertically.  An 0 Vicryl pursestring  suture was placed around the fascial defect.  Hasson trocar was placed  in the abdomen and pneumoperitoneum was obtained.  Under direct vision,  a 11 mm trocar was placed in subxiphoid region, two 5 mm trocars were  placed in the right abdomen.  Gallbladder was identified and retracted  cephalad.  Dissection at the infundibulum of the gallbladder and  obtaining a critical view of closed triangle, the cystic duct was  identified.  It was clipped and was very very small.  Small ductotomy  was made.  However, the duct was almost pinhole size and I was unable to  cannulate it.  After getting adequate lengths in the distal cystic duct,  it was triply clipped and divided.  Cystic artery is identified with a  critical view in a similar fashion triply clipped and divided.  The  gallbladder was taken off the gallbladder bed using Bovie  electrocautery, placed in EndoCatch bag and removed through the  umbilical port.  Adequate hemostasis was  assured and the right upper  quadrant was copiously irrigated.  After trocars were removed, skin was  closed with subcuticular 4-0 Monocryl.  Steri-Strips and sterile  dressings were applied.  The patient tolerated the procedure well went  to PACU in good condition.      Alfonse Ras, MD  Electronically Signed     KRE/MEDQ  D:  12/23/2007  T:  12/23/2007  Job:  [id number]

## 2010-10-21 NOTE — Op Note (Signed)
NAMEKYLENE, ZAMARRON           ACCOUNT NO.:  [id number]   MEDICAL RECORD NO.:  [id number]          PATIENT TYPE:  INP   LOCATION:  5002                         FACILITY:  [facility]   PHYSICIAN:  Jefry H. Pollyann Kennedy, MD     DATE OF BIRTH:  [birth year]   DATE OF PROCEDURE:  01/21/2009  DATE OF DISCHARGE:                               OPERATIVE REPORT   PREOPERATIVE DIAGNOSIS:  Pharyngeal mass.   POSTOPERATIVE DIAGNOSIS:  Pharyngeal mass.   PROCEDURE:  Excision of pharyngeal mass.   SURGEON:  Pollyann Kennedy.   ANESTHESIA:  General endotracheal anesthesia was used.   COMPLICATIONS:  None.   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  Previous biopsy revealed no evidence of malignancy.   BLOOD LOSS:  Approximately 100 mL.   HISTORY:  A 40 year old with a history of 3-week progressive pharyngeal  obstruction.  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  Pathologic evaluation and felt biopsies revealed no evidence of lymphoma  or carcinoma.  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  Risks,  benefits, alternatives, and complications of the procedure were  explained to the patient and seemed to understand and agreed to surgery.   PROCEDURE:  The patient was taken to the operating room, placed on the  operating table in supine position.  Following induction of general  endotracheal anesthesia using the previously placed tracheostomy, the  table was turned and the patient was draped in standard fashion.  CroweEarlene Plater mouth gag was inserted into the oral cavity, used to retract the  tongue and mandible and attached to the Mayo stand.  A red rubber  catheter was inserted into the right side of the nose, withdrawn through  the mouth and used to retract the soft palate uvula.  Indirect  inspection of the base of tongue and valleculae revealed the above-  mentioned findings.  The sinus  microdebrider was used with a Tri-Cut  blade to reduce the lymphoid hyperplastic tissue starting on the left  side.  I was able to reduce as much as I was able to visualize on the  left and then this was repeated on the right side.  There is significant  reduction of the base of tongue and vallecular tissue.  Significant  bleeding was controlled using intermittent packing and topical  adrenaline on a 4 x 4 and suction cautery.  After adequate hemostasis  was achieved, the pharynx was irrigated with saline and suctioned.  The  tracheostomy was removed and an endotracheal tube was placed during  induction of  anesthesia.  There was some bleeding that occurred in the trache site.  Suction cautery was used to cauterize a small bleeding site from the  granulation tissue within the tract.  A fresh #6 uncuffed trache tube  was then placed at the end of the procedure.  The patient was awakened,  transferred to recovery in stable condition.      Jefry H. Pollyann Kennedy, MD  Electronically Signed     JHR/MEDQ  D:  01/21/2009  T:  01/21/2009  Job:  [id number]

## 2010-10-21 NOTE — Op Note (Signed)
[name], [name]           ACCOUNT NO.:  [id number]   MEDICAL RECORD NO.:  [id number]          PATIENT TYPE:  AMB   LOCATION:  ENDO                         FACILITY:  [facility]   PHYSICIAN:  Graylin Shiver, M.D.   DATE OF BIRTH:  [birth year]   DATE OF PROCEDURE:  02/14/2008  DATE OF DISCHARGE:                               OPERATIVE REPORT   PROCEDURE:  Esophagogastroduodenoscopy with biliary stent removal.   INDICATIONS FOR PROCEDURE:  The patient had a bile leak status post  cholecystectomy and had a biliary stent placed.  She is doing well now  and it is time for the biliary stent to be removed.   Informed consent was obtained after explanation of the risks of  bleeding, infection, and perforation.   PREMEDICATIONS:  Fentanyl 75 mcg IV, Versed 7 mg IV.   PROCEDURE:  With the patient in the left lateral decubitus position, the  Pentax gastroscope was inserted into the oropharynx and passed into the  esophagus.  It was advanced down the esophagus, then into the stomach  and into the duodenum.  No lesions were seen in the esophagus, stomach,  or the duodenum.  The biliary stent was seen coming out of the papilla  of Vater.  It was grasped with a snare and removed.  She tolerated the  procedure well without complications.   IMPRESSION:  Biliary stent removed.   The patient will follow up p.r.n.           ______________________________  Graylin Shiver, M.D.     SFG/MEDQ  D:  02/14/2008  T:  02/15/2008  Job:  [id number]   cc:   Alfonse Ras, MD  [address]  [place]  [place] [address]

## 2010-10-21 NOTE — H&P (Signed)
NAMESHERLYNN, TOURVILLE           ACCOUNT NO.:  [id number]   MEDICAL RECORD NO.:  [id number]          PATIENT TYPE:  INP   LOCATION:  1532                         [facility]ACILITY:  [facility]   PHYSICIAN:  Alfonse Ras, MD   DATE OF BIRTH:  [birth year]   DATE OF ADMISSION:  12/27/2007  DATE OF DISCHARGE:                              HISTORY & PHYSICAL   ADMISSION DIAGNOSIS:  Biliary leak status post laparoscopic  cholecystectomy.   ADMITTING PHYSICIAN:  Michaelle Birks.   CONSULTANT PHYSICIAN:  Dr. Dorena Cookey   HISTORY OF PRESENT ILLNESS:  The patient is a very pleasant 40 year old  white female who was referred to me for biliary dyskinesia.  She  underwent laparoscopic cholecystectomy on July 17, which was uneventful  except the cystic duct could not be cannulated because of its very small  diameter.  The patient was doing well postoperatively and was discharged  home the morning after surgery.  However, last night began having severe  epigastric abdominal pain and called our office.  She was sent to the  [facility] Emergency Room, which showed normal white count of 9000 and  actually about the same liver function tests as she had preoperatively  only elevation is in ALT of 61.  Bilirubin is 1.1.  I ordered a HIDA  scan, which showed diffuse leak from the biliary system probably most  consistent with a cystic duct leak.  The patient continues with  epigastric discomfort.  I discussed this with Dr. Madilyn Fireman and we will  proceed with ERCP.   PAST MEDICAL HISTORY:  Significant for hypertension and hypothyroidism.   MEDICATIONS:  Include metoprolol and Synthroid.  Synthroid at 50 mcg,  metoprolol at unknown dose.   REVIEW OF SYSTEMS:  Significant for nausea, vomiting, and abdominal  pain.  Denies fever, chills, __________ stools or dark urine.   PHYSICAL EXAMINATION:  On physical exam, she is an age-appropriate white  female in moderate distress.  Abdominal exam shows tenderness  diffusely,  particularly in the upper abdomen.  LUNGS: Clear to auscultation and  percussion x2.  HEART: Regular rate and rhythm without murmurs, rubs or  gallops.   IMPRESSION:  Probable cystic duct leak status post laparoscopic  cholecystectomy.   PLAN:  Admission, IV antibiotics, ERCP.  I have discussed this with Dr.  Dorena Cookey.      Alfonse Ras, MD  Electronically Signed     KRE/MEDQ  D:  12/27/2007  T:  12/28/2007  Job:  [id number]   cc:   Everardo All. Madilyn Fireman, M.D.  Fax: [phone]

## 2010-10-21 NOTE — Discharge Summary (Signed)
NAMESTEPHANA, Hale           ACCOUNT NO.:  [id number]   MEDICAL RECORD NO.:  [id number]          PATIENT TYPE:  INP   LOCATION:  5002                         FACILITY:  [facility]   PHYSICIAN:  Jefry H. Pollyann Kennedy, MD     DATE OF BIRTH:  10-01-1970   DATE OF ADMISSION:  01/14/2009  DATE OF DISCHARGE:  01/24/2009                               DISCHARGE SUMMARY   ADMISSION DIAGNOSES:  1. Large thyroid goiter.  2. Vallecular mass.   DISCHARGE DIAGNOSES:  1. Large thyroid goiter.  2. Hypothyroidism.  3. Hashimoto thyroiditis.  4. Hypertrophy of the lingual tonsils.  5. Status post direct laryngoscopy and biopsy, status post      tracheostomy, status post excision of lingual tonsils.   REFERRING PHYSICIAN:  Madelin Rear. Fusco, MD   HISTORY:  A 40 year old lady was seen in the office several days prior  to admission with a 3-week history of worsening voice breathing and  swallowing problems.  She had a known goiter for a couple of years and  was on Synthroid.  She is admitted to the hospital on January 14, 2009,  where she underwent an elective direct laryngoscopy with biopsy and  tracheostomy.  She tolerated this well.  The biopsies of the vallecular  mass were negative for lymphoma or carcinoma.  Biopsy of the thyroid  isthmus, which was performed incidental, so the tracheostomy was  consistent with Hashimoto thyroiditis.  Once these results were  obtained, she was taken back to the operating room on January 21, 2009,  where she underwent an excision of lingual tonsils.  At this point, she  had a #6 uncuffed trach in place.  Postoperatively, she was able to  phonate with a tracheostomy, which she was unable to do prior to the  second procedure.  She was also able to swallow better.  Her trach was  capped on the day prior to discharge, which she tolerated  intermittently.  On day of discharge, her trach was removed and with the  whole held closed, she was able to lie supine, which is a  big  improvement over preop.  She is able to eat, drink, and talk without  difficulty.  She was discharged to home in good condition.  Instructed  to keep her trache wound closed as much as possible.  Keep it clean and  dry, take amoxicillin 500 mg t.i.d., Lortab 7.5/500 1-2 by mouth every 4-  6 hours as needed.  Her Synthroid was increased during hospitalization  to 100 mcg daily.  She is to follow up with me in 1 week.  I will make  arrangements as an outpatient for her to see an endocrinologist.      Jeannett Senior. Pollyann Kennedy, MD  Electronically Signed     JHR/MEDQ  D:  01/24/2009  T:  01/24/2009  Job:  [id number]

## 2010-10-24 NOTE — H&P (Signed)
[facility]  Patient:    [name], [name]                       MRN: [id number] Adm. Date:  08/06/00 Attending:  Duke Salvia. Marcelle Overlie, M.D.                         History and Physical  CHIEF COMPLAINT:              Requests permanent sterilization.  HISTORY OF PRESENT ILLNESS:   A 40 year old G3 P3.  Patients last cesarean was August 2001.  Presents now for tubal ligation.  She is sure she would not want to be pregnant again under any circumstance.  We have discussed the tubal procedure extensively, the permanence of the procedure, failure rate of 2-3 per 1000, other risks regarding bleeding or infection, complications that may require open or additional surgery all reviewed with her which she understands and accepts.  At the time of her last C section done in [facility], she was told she had a significant bladder flap scarring and has also had some problems since then with mild to moderate variable pelvic pain.  Currently using condoms for contraception.  PAST MEDICAL HISTORY:  ALLERGIES:                    None.  OPERATIONS:                   Cesarean section x 3.  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  She has also had a tonsillectomy.  PHYSICAL EXAMINATION:  VITAL SIGNS:                  Temperature 98.2, blood pressure 122/70.  HEENT:                        Unremarkable.  NECK:                         Supple without masses.  LUNGS:                        Clear.  CARDIOVASCULAR:               Regular rate and rhythm without murmurs, rubs, or gallops noted.  BREASTS:                      Without masses.  ABDOMEN:                      Soft, flat, nontender.  PELVIC:                       Normal external genitalia, vagina and cervix clear.  Uterus mid position, normal size, slightly tender to manipulation. Adnexa without masses or tenderness.  EXTREMITIES  AND NEUROLOGIC:   Unremarkable.  IMPRESSION:                   1. Requests permanent sterilization.                               2. Mild to moderate  pelvic pain.  PLAN:                         Diagnostic laparoscopy, possible lysis of adhesions, tubal ligation by Filshie clip application.  Procedure and risks discussed as above. DD:  08/03/00 TD:  08/03/00 Job: [id number]

## 2010-10-24 NOTE — Discharge Summary (Signed)
NAMESHAMIKA, PEDREGON                        ACCOUNT NO.:  [id number]   MEDICAL RECORD NO.:  [id number]                   PATIENT TYPE:  INP   LOCATION:  A426                                 FACILITY:  [facility]   PHYSICIAN:  Lazaro Arms, M.D.                DATE OF BIRTH:  07-05-1970   DATE OF ADMISSION:  08/10/2002  DATE OF DISCHARGE:  08/13/2002                                 DISCHARGE SUMMARY   DISCHARGE DIAGNOSES:  1. Status post total abdominal hysterectomy, bilateral salpingo-     oophorectomy.  2. Postoperative anemia secondary to surgery.  3. Otherwise unremarkable postoperative course.   PROCEDURE:  TAHBSO.   HISTORY OF PRESENT ILLNESS:  Please refer to the transcribed history and  physical and operative note for details of the admission to the hospital.   HOSPITAL COURSE:  The patient was admitted postoperatively.  Overall she did  quite well.  She initially had diminished urine output, and it was somewhat  difficult to determine what the source was.  She had a night of surgery H&H  of 10 and 29.9.  She was given multiple fluid boluses and then a small  amount of Lasix which seemed to cause her urine output to be good  thereafter.  She probably had a narcotic-induced syndrome of inappropriate  antidiuretic hormone or either she was just very hypovolemic from the bowel  prep preoperatively.  On postoperative day #1 she had an H&H and 8.5 and 24,  and on the morning of postoperative day #2 it was 6.4 and 18.  That was a  large drop that I thought was reasonably expected.  I examined her  thoroughly.  At the time she was not orthostatic and having no problems with  ambulation.  Her abdominal exam was essentially benign.  There was no  evidence on exam of ongoing intraperitoneal blood loss.  She did have some  bleeding from a muscle and subcu that was taken care of at the time of  surgery, so I was not 100% sure about the source of bleeding.  She did not  appear to have  any incisional hematoma.  My suspicion is that she had some  bleeding from the muscle on the right side that was probably dripping into  the peritoneal cavity, but again, there was no evidence of any significant  intraperitoneal bleeding.  I rechecked it again that evening, and it was  stable and rechecked it again this morning which was postoperative day #3,  and it remained stable at 6.4 and 18.9.  Her white count was 5400, platelet  count 168,000.  The patient did received Toradol postoperatively, and that  could explain some of the drop in platelets.  She had a platelet nadir of  144,000.  In any event, she is stable without symptoms.  Her abdominal exam  is benign.  She is tolerating oral pain medicines.  She  is tolerating clear  liquids and a regular diet.  She is voiding without symptoms.  The patient  had a bladder injury at the time of her last C-section, but she is voiding  well without any problems, and she is having no vaginal bleeding.  She does  have some dependent bruising in the incision consistent with subcutaneous  settling of some subcutaneous bleeding but nothing major.  She works in our  office, and is instructed to come back to the office for evaluation on  Wednesday, 08/16/2002 at 11 a.m. but certainly to call prior to that if she  needs any other follow up.    DISCHARGE MEDICATIONS:  1. Levaquin 500 mg one p.o. daily.  2. Tylox one to two every four to six hours as needed.                                               Lazaro Arms, M.D.    Loraine Maple  D:  08/13/2002  T:  08/14/2002  Job:  [id number]

## 2010-10-24 NOTE — H&P (Signed)
NAME:  [name], [name]                        ACCOUNT NO.:  [id number]   MEDICAL RECORD NO.:  [id number]                   PATIENT TYPE:  AMB   LOCATION:  DAY                                  FACILITY:  [facility]   PHYSICIAN:  Lazaro Arms, M.D.                DATE OF BIRTH:  [birth year]   DATE OF ADMISSION:  08/10/2002  DATE OF DISCHARGE:                                HISTORY & PHYSICAL   HISTORY OF PRESENT ILLNESS:  The patient is a 40 year old white female,  gravida 3, para 3, status post tubal ligation, who is admitted for a  TAH/BSO.  The patient has been having a great deal of abdominopelvic pain,  dysmenorrhea and hypermenorrhea now for several years since really the birth  of her last child.  She underwent a repeat cesarean section that took  approximately five hours.  I have examined her on several occasions and did  an ultrasound.  The ultrasound was basically normal but she, on exam, feels  like her uterus and ovaries are stuck down.  The only pertinent finding on  ultrasound were multiple follicular cysts consistent with polycystic ovary  syndrome.  I tried her on birth control pills, which did not work.  We tried  her on nonsteroidal anti-inflammatory drugs, which did not work.  She has  horrible dyspareunia as well.  I tried an empiric trial of Lupron as well  for five months, none of which helped.  I really think the adhesions are  what are causing her significant pain, as a result, she is admitted for  TAH/BSO.   PAST MEDICAL HISTORY:  Depression.   PAST SURGICAL HISTORY:  Three C-sections, the last with a tubal ligation.   PAST OBSTETRICAL HISTORY:  As above.   ALLERGIES:  None.   MEDICATIONS:  1. Lexapro 20 mg daily.  2. Anti-inflammatory medication.  3. She currently has Lupron on board and Femring in place for add-back     therapy.   REVIEW OF SYSTEMS:  Review of systems is as per the HPI, otherwise,  negative.   PHYSICAL EXAMINATION:  HEENT:   Unremarkable.  NECK:  Thyroid is normal.  LUNGS:  Lungs are clear.  HEART:  Heart is regular rate and rhythm without murmur, regurgitation or  gallop.  BREASTS:  Breasts without mass, discharge or skin changes.  ABDOMEN:  Abdomen is benign.  No hepatosplenomegaly or masses.  She does  have tenderness in both lower quadrants.  PELVIC:  Pelvic reveals tenderness throughout.  The uterus and ovaries all  feel high and adherent/stuck and quite tender to palpation.  EXTREMITIES:  Extremities are warm with no edema.  NEUROLOGIC:  Exam is grossly intact.   IMPRESSION:  1. Chronic pelvic pain, unresponsive to all conservative measures.  2. Dyspareunia.  3. Hypermenorrhea.  4. Dysmenorrhea.   PLAN:  The patient is admitted for TAH/BSO.  She has been bowel-prepped.  I  am quite concerned about the adhesions that are going to be present.  She  has a previous bladder injury with her cesarean section.  She understands  the risks of bladder injury and bowel injury but we will proceed.                                               Lazaro Arms, M.D.    Loraine Maple  D:  08/09/2002  T:  08/10/2002  Job:  [id number]

## 2010-10-24 NOTE — Op Note (Signed)
[facility]  Patient:    [name], [name]                     MRN: [id number] Proc. Date: 08/06/00 Adm. Date:  [id number] Attending:  Rhina Brackett                           Operative Report  PREOPERATIVE DIAGNOSIS:       Requests permanent sterilization.  POSTOPERATIVE DIAGNOSES:      1. Requests permanent sterilization.                               2. Pelvic adhesions.  PROCEDURE:                    Diagnostic laparoscopy, tubal ligation by                               Filshie clip application.  SURGEON:                      Duke Salvia. Marcelle Overlie, M.D.  ANESTHESIA:                   General endotracheal.  COMPLICATIONS:                None.  DRAINS:                       In and out Foley catheter.  ESTIMATED BLOOD LOSS:         Less than 5 cc.  PROCEDURE AND FINDINGS:       The patient went to the operating room. After an adequate level of general endotracheal anesthesia was obtained, with the patients legs in stirrups, the abdomen, perineum, and vagina were prepped and draped in the usual manner for laparoscopy. The bladder was drained. EUA was carried out. The uterus was significantly anterior, not very mobile; adnexa negative. A Hulka tenaculum was positioned. Attention directed to the abdomen. A 2-cm subumbilical incision was made. This was infiltrated with 0.5% Marcaine, plain, prior to this. A Veress needle was introduced without difficulty. Its intra-abdominal position was verified by pressure and water testing. After a 2.5 liter pneumoperitoneum was then created, laparoscopic trocar and sleeve were then introduced. No evidence of any bleeding or trauma. Initial inspection reveals some omental adhesions into the left lower anterior abdominal wall. Also, the left fundus of the uterus was densely adherent to the anterior abdominal wall. Under direct visualization, 4 to 5 cm above the symphysis, slightly to the right of midline,  after negative transillumination, a 5-mm trocar was inserted under direct visualization and the blunt probe was inserted. The patient in Trendelenburg and the uterus anteflexed, the pelvic findings as follows.  Again, the uterus itself was normal size but was densely adherent from the left cornual and anterior fundal area into the lower abdominal wall. Right tube and ovary were normal. The left tube and ovary were deviated and the distal tube was adherent into omentum into the left lower anterior abdominal wall. Filshie clip applicator was backloaded. Filshie clip was applied to the right tube after carefully identifying it and placing it on traction. The Filshie clip was applied at right angle 2 cm from the cornua with excellent application. The  exact same repeated on the opposite side. I did not lyse the omental and distal tubal adhesions because of the significant scarring in the anterior uterus which may require other surgery later. After the Filshie clip applicator was removed, the application was checked close up and the tube traced again to the fimbriated end. Excellent application on both sides were noted. She tolerated this well. Instruments were removed. Gas was allowed to escape. Defects closed with 4-0 Dexon subcuticular sutures. She went to recovery room in good condition. DD:  08/06/00 TD:  08/07/00 Job: [id number]

## 2010-10-24 NOTE — Op Note (Signed)
[name], [name]                        ACCOUNT NO.:  [id number]   MEDICAL RECORD NO.:  [id number]                   PATIENT TYPE:  AMB   LOCATION:  DAY                                  FACILITY:  [facility]   PHYSICIAN:  Lazaro Arms, M.D.                DATE OF BIRTH:  [birth year]   DATE OF PROCEDURE:  08/10/2002  DATE OF DISCHARGE:                                 OPERATIVE REPORT   PREOPERATIVE DIAGNOSES:  1. Chronic pelvic pain.  2. Dyspareunia.  3. Pelvic adhesive disease.   POSTOPERATIVE DIAGNOSES:  1. Chronic pelvic pain.  2. Dyspareunia.  3. Pelvic adhesive disease.   PROCEDURE:  Total abdominal hysterectomy, bilateral salpingo-oophorectomy.   ANESTHESIA:  General endotracheal.   SURGEON:  Lazaro Arms, M.D.   FINDINGS:  The patient had a significant amount of omentum adherent to the  anterior abdominal wall. She had some small bowel adhesions that were taken  down without difficulty. Mostly she had dense adhesions of the bladder to  the anterior wall of the uterus and anterior wall of the uterus was stuck to  the anterior abdominal wall as well. The ovaries were also adherent to the  pelvic sidewalls but not nearly to the degree that the uterus, bladder and  anterior abdominal wall were. Intraperitoneal otherwise appeared to be  normal.   DESCRIPTION OF PROCEDURE:  The patient was taken to the operating room,  placed in the supine position where she underwent general endotracheal  anesthesia. She was prepped and draped in the usual sterile fashion. Her  vagina was prepped. Her FemRing that she had been using for add-back therapy  with her Lupron was also removed preoperatively. She was shaved, prepped and  draped in the usual sterile fashion. A vertical skin incision was made and  carried from the umbilicus down to the pubis. This was carried down sharply  to the rectus fascia which was incised. The muscles were divided almost at  the level of the umbilicus  because I was concerned about her previous  surgical history and entered the peritoneal cavity under direct  visualization without difficulty. The omental adhesions were found and taken  down with Kelly clamp and interrupted suture. The fascia was opened up  inferiorly as well and the bladder uterus complex was adherent to the  anterior abdominal wall. Great care was taken to avoid injuring the bladder.  The patient did have a bladder injury with her last cesarean section. The  uterus was elevated, the infundibulopelvic ligament was isolated, double  clamped, cut and double suture ligated. The uterine vessels were  skeletonized, the round ligament was suture ligated and cut. There was a  very thick adhesions probably about a centimeter and a half in width that  was adherent across the entire uterus that took up most of the uterine wall.  I dissected behind this area, it was free of bladder and  then I dissected it  off of the uterus. It probably took a tiny bit of myometrium with it at  least uterine serosa and that was adherent to the bladder. I did not dare  take this down because I have a feeling this is where she had her bladder  injury before and it healed up in this way and I did not want to cause  another bladder defect here. I took great care in taking the bladder down  sharply. I did not do it bluntly at all. The right round ligament was suture  ligated and cut. The Balfour self retaining retractor could be placed at  this point including the bladder blade. I suture ligated the round ligament  and made and avascular window in the infundibulopelvic ligament. It was  clamped, cut and doubly suture ligated and the uterine vessels were  skeletonized. The uterine vessels were clamped, cut and suture ligated  bilaterally. With the bladder well down off the lower uterine segment, the  cardinal ligament was clamped, cut, transfixion suture ligated and cut. Each  pedicle was taken down the  cervix. I then got down below the cervix and  cross clamped the vagina and removed the specimen. The vaginal angle sutures  were placed and interrupted figure-of-eight sutures were placed for bladder  closure and hemostasis. A couple of interrupted hemoclips that were used for  hemostasis and a couple of interrupted sutures were placed again for  hemostasis medial to the uterine vessel pedicle to avoid ureteral  embarrassment. The pelvis was irrigated vigorously, all pedicles found to be  hemostatic at this point. The subcutaneous tissue, fascia, muscle and  peritoneum were closed in a modified Smead-Jones fashion, far-far-near-near  with an #0 PDS loop without difficulty. The subcutaneous tissue was made  hemostatic and irrigated. The skin was closed using skin staples. The  patient tolerated the procedure well. She experienced 400 mL of blood loss  and was taken to the recovery room in good stable condition. All counts were  correct x3. All specimens were sent to the lab for evaluation. She received  2100 mL off crystalloid and had urine output of 400 mL of clear fluid. There  were no complications. She was not packed.                                                Lazaro Arms, M.D.    Loraine Maple  D:  08/10/2002  T:  08/10/2002  Job:  [id number]

## 2011-03-03 LAB — POCT CARDIAC MARKERS
CKMB, poc: 1.2
CKMB, poc: 2.1
Myoglobin, poc: 41.8
Myoglobin, poc: 52.3
Operator id: 272551
Operator id: 295131
Troponin i, poc: 0.07 — ABNORMAL HIGH
Troponin i, poc: <0.05

## 2011-03-03 LAB — CARDIAC PANEL(CRET KIN+CKTOT+MB+TROPI)
CK, MB: 3
CK, MB: 3.1
Relative Index: 1
Total CK: 367 — ABNORMAL HIGH
Troponin I: 0.01

## 2011-03-03 LAB — CBC
Hemoglobin: 13.4
MCHC: 34.7
Platelets: 229
Platelets: 236
RDW: 13.1
RDW: 13.2
WBC: 7.9

## 2011-03-03 LAB — PHOSPHORUS: Phosphorus: 4.3

## 2011-03-03 LAB — HEPATIC FUNCTION PANEL
ALT: 45 — ABNORMAL HIGH
AST: 31
Albumin: 3.7
Alkaline Phosphatase: 62
Bilirubin, Direct: <0.1
Total Bilirubin: 0.4

## 2011-03-03 LAB — COMPREHENSIVE METABOLIC PANEL
AST: 29
Albumin: 3.5
BUN: 9
Calcium: 8.4
Chloride: 104
Creatinine, Ser: 0.83
GFR calc Af Amer: >60
GFR calc non Af Amer: >60
Total Bilirubin: 0.4

## 2011-03-03 LAB — DIFFERENTIAL
Basophils Absolute: 0.1
Basophils Absolute: 0.1
Basophils Relative: 1
Eosinophils Absolute: 0.2
Eosinophils Relative: 3
Eosinophils Relative: 3
Lymphocytes Relative: 32
Lymphocytes Relative: 34
Lymphs Abs: 2.5
Lymphs Abs: 3
Monocytes Absolute: 0.8
Monocytes Absolute: 0.9
Monocytes Relative: 11
Neutro Abs: 4.4
Neutro Abs: 4.6
Neutrophils Relative %: 53

## 2011-03-03 LAB — POCT I-STAT, CHEM 8
Glucose, Bld: 115 — ABNORMAL HIGH
HCT: 40
Hemoglobin: 13.6
Potassium: 3.9
TCO2: 29

## 2011-03-03 LAB — LIPID PANEL
Cholesterol: 139
HDL: 28 — ABNORMAL LOW
LDL Cholesterol: 97
Total CHOL/HDL Ratio: 5
VLDL: 14

## 2011-03-03 LAB — HOMOCYSTEINE: Homocysteine: 7.7

## 2011-03-03 LAB — MAGNESIUM: Magnesium: 2.1

## 2011-03-03 LAB — D-DIMER, QUANTITATIVE: D-Dimer, Quant: 0.22

## 2011-03-03 LAB — CK TOTAL AND CKMB (NOT AT ARMC)
Relative Index: 1.8
Total CK: 170

## 2011-03-03 LAB — HEMOGLOBIN A1C: Mean Plasma Glucose: 136

## 2011-03-06 LAB — COMPREHENSIVE METABOLIC PANEL
AST: 39 — ABNORMAL HIGH
CO2: 28
CO2: 31
Calcium: 8.5
Calcium: 9.6
Creatinine, Ser: 0.69
Creatinine, Ser: 0.81
GFR calc Af Amer: >60
GFR calc non Af Amer: >60
GFR calc non Af Amer: >60
Glucose, Bld: 136 — ABNORMAL HIGH
Sodium: 143
Total Protein: 7.9

## 2011-03-06 LAB — DIFFERENTIAL
Basophils Absolute: 0
Basophils Absolute: 0
Eosinophils Relative: 4
Lymphocytes Relative: 12
Lymphocytes Relative: 37
Lymphs Abs: 1.2
Monocytes Absolute: 0.6
Neutro Abs: 2.6
Neutro Abs: 7.6
Neutrophils Relative %: 47

## 2011-03-06 LAB — CBC
Hemoglobin: 13.1
Hemoglobin: 14.1
MCHC: 33.3
MCHC: 34.1
MCV: 82.5
MCV: 83.5
Platelets: 200
RBC: 4.66
RBC: 4.84
RDW: 12.8
RDW: 13
WBC: 9.4

## 2011-03-06 LAB — LIPASE, BLOOD: Lipase: 94 — ABNORMAL HIGH

## 2011-03-06 LAB — GLUCOSE, CAPILLARY: Glucose-Capillary: 118 — ABNORMAL HIGH

## 2011-03-06 LAB — HEPATIC FUNCTION PANEL
ALT: 61 — ABNORMAL HIGH
AST: 27
Alkaline Phosphatase: 90
Bilirubin, Direct: 0.4 — ABNORMAL HIGH
Total Protein: 8.4 — ABNORMAL HIGH

## 2011-05-19 ENCOUNTER — Ambulatory Visit: Payer: Self-pay | Admitting: Cardiology

## 2011-05-29 ENCOUNTER — Encounter: Payer: Self-pay | Admitting: Cardiology

## 2011-05-29 ENCOUNTER — Ambulatory Visit (INDEPENDENT_AMBULATORY_CARE_PROVIDER_SITE_OTHER): Payer: Medicaid Other | Admitting: Cardiology

## 2011-05-29 VITALS — BP 121/81 | HR 84 | Ht 66.5 in | Wt 211.0 lb

## 2011-05-29 DIAGNOSIS — R42 Dizziness and giddiness: Secondary | ICD-10-CM

## 2011-05-29 DIAGNOSIS — Z0189 Encounter for other specified special examinations: Secondary | ICD-10-CM

## 2011-05-29 DIAGNOSIS — R55 Syncope and collapse: Secondary | ICD-10-CM

## 2011-05-29 DIAGNOSIS — E119 Type 2 diabetes mellitus without complications: Secondary | ICD-10-CM

## 2011-05-29 DIAGNOSIS — E039 Hypothyroidism, unspecified: Secondary | ICD-10-CM

## 2011-05-29 NOTE — Patient Instructions (Signed)
**Note De-Identified [name] Obfuscation** Your physician has recommended that you wear an event monitor. Event monitors are medical devices that record the heart's electrical activity. Doctors most often [place] these monitors to diagnose arrhythmias. Arrhythmias are problems with the speed or rhythm of the heartbeat. The monitor is a small, portable device. You can wear one while you do your normal daily activities. This is usually used to diagnose what is causing palpitations/syncope (passing out).  Your physician recommends that you schedule a follow-up appointment in: 1 months

## 2011-05-29 NOTE — Assessment & Plan Note (Signed)
Excellent control of diabetes with current medical regime.

## 2011-05-29 NOTE — Progress Notes (Signed)
Patient ID: [name], female   DOB: 1971-01-10, 40 y.o.   MRN: [id number] HPI: Initial visit for this nice woman, kindly referred by Dr. Sherwood Gambler, for evaluation of near syncope.  [name] has enjoyed generally good health.  For nearly the past year she has experienced episodes of lightheadedness without syncope.  These almost exclusively occur when she is standing or walking.  Symptoms pass within a matter of minutes without specific action or treatment.  She feels better if she sits down.  She has had some associated palpitations without an increase in heart rate.  She notes chest discomfort intermittently that is not associated with near syncope.  Episodes of lightheadedness occur every week or so.  She cannot identify anything that elicits the symptoms or relieved them.  Current Outpatient Prescriptions  Medication Sig Dispense Refill  . aspirin 81 MG tablet Take 81 mg by mouth daily.        Marland Kitchen glipiZIDE (GLUCOTROL) 5 MG tablet Take 5 mg by mouth daily.        . metFORMIN (GLUCOPHAGE) 1000 MG tablet Take 1,000 mg by mouth 2 (two) times daily with a meal.        . thyroid (ARMOUR) 90 MG tablet Take 90 mg by mouth daily.         Allergies  Allergen Reactions  . Food     Seafood      Past Medical History  Diagnosis Date  . Diabetes mellitus, type 2 2011    Rx with oral medication  . Hypothyroid      Past Surgical History  Procedure Date  . Cholecystectomy   . Cesarean section   . Total abdominal hysterectomy w/ bilateral salpingoophorectomy   . Larynx surgery     benign mass resected-identity uncertain  . Tonsillectomy      Family History  Problem Relation Age of Onset  . Hypertension Mother   . Hyperlipidemia Mother      History   Social History  . Marital Status: Legally Separated    Spouse Name: N/A    Number of Children: 3  . Years of Education: N/A   Occupational History  . Nursing Student    Social History Main Topics  . Smoking status: Never Smoker    . Smokeless tobacco: Never Used  . Alcohol Use: No  . Drug Use: Not on file  . Sexually Active: Not on file   Other Topics Concern  . Not on file   Social History Narrative  . No narrative on file   ROS:  Mild intermittent pedal edema.  All other systems reviewed and are negative.  PHYSICAL EXAM: BP 121/81  Pulse 84  Ht 5' 6.5" (1.689 m)  Wt 95.709 kg (211 lb)  BMI 33.55 kg/m2  General-Well-developed; no acute distress; mildly hirsute Body Habitus-moderately overweight HEENT-[place]/AT; PERRL; EOM intact; conjunctiva and lids nl Neck-No JVD; no carotid bruits Endocrine-No thyromegaly Lungs-Clear lung fields; resonant percussion; normal I-to-E ratio Cardiovascular- normal PMI; normal S1 and S2; grade 2/6 systolic ejection murmur Abdomen-BS normal; soft and non-tender without masses or organomegaly Musculoskeletal-No deformities, cyanosis or clubbing Neurologic-Nl cranial nerves; symmetric strength and tone Skin- Warm, no significant lesions Extremities-Nl distal pulses; no edema  EKG:  Normal sinus rhythm, rightward axis, otherwise unremarkable.  No previous tracing for comparison.   ASSESSMENT AND PLAN:  Ricketts Bing, MD 05/29/2011 5:19 PM

## 2011-06-03 NOTE — Assessment & Plan Note (Signed)
Episodes are certainly consistent with transient cerebral hypoperfusion but not diagnostic.  There is not much to suggest an arrhythmia, orthostatic hypotension, drug-related hypotension, or a neurocardiogenic mechanism.  An echocardiogram in 2009 revealed no structural heart disease.  We will proceed with a 3 week period of home monitoring.  Based on the results of that study and frequency of symptoms, additional testing can be performed as needed.  Patient will return in one month for reevaluation.  She is advised to call immediately should frank syncope or any fall occur.

## 2011-06-15 ENCOUNTER — Ambulatory Visit: Payer: Self-pay | Admitting: Cardiology

## 2011-06-29 ENCOUNTER — Ambulatory Visit: Payer: Medicaid Other | Admitting: Cardiology

## 2011-08-24 ENCOUNTER — Emergency Department (HOSPITAL_COMMUNITY)
Admission: EM | Admit: 2011-08-24 | Discharge: 2011-08-24 | Disposition: A | Discharge status: Home / Self Care | Payer: No Typology Code available for payment source | Attending: Emergency Medicine | Admitting: Emergency Medicine

## 2011-08-24 ENCOUNTER — Emergency Department (HOSPITAL_COMMUNITY): Payer: No Typology Code available for payment source

## 2011-08-24 ENCOUNTER — Encounter (HOSPITAL_COMMUNITY): Payer: Self-pay | Admitting: *Deleted

## 2011-08-24 DIAGNOSIS — Z9089 Acquired absence of other organs: Secondary | ICD-10-CM | POA: Insufficient documentation

## 2011-08-24 DIAGNOSIS — R0789 Other chest pain: Secondary | ICD-10-CM

## 2011-08-24 DIAGNOSIS — R071 Chest pain on breathing: Secondary | ICD-10-CM | POA: Insufficient documentation

## 2011-08-24 DIAGNOSIS — S161XXA Strain of muscle, fascia and tendon at neck level, initial encounter: Secondary | ICD-10-CM

## 2011-08-24 DIAGNOSIS — E119 Type 2 diabetes mellitus without complications: Secondary | ICD-10-CM | POA: Insufficient documentation

## 2011-08-24 DIAGNOSIS — E039 Hypothyroidism, unspecified: Secondary | ICD-10-CM | POA: Insufficient documentation

## 2011-08-24 DIAGNOSIS — R079 Chest pain, unspecified: Secondary | ICD-10-CM | POA: Insufficient documentation

## 2011-08-24 DIAGNOSIS — S301XXA Contusion of abdominal wall, initial encounter: Secondary | ICD-10-CM | POA: Insufficient documentation

## 2011-08-24 DIAGNOSIS — R109 Unspecified abdominal pain: Secondary | ICD-10-CM | POA: Insufficient documentation

## 2011-08-24 DIAGNOSIS — M542 Cervicalgia: Secondary | ICD-10-CM | POA: Insufficient documentation

## 2011-08-24 DIAGNOSIS — S139XXA Sprain of joints and ligaments of unspecified parts of neck, initial encounter: Secondary | ICD-10-CM | POA: Insufficient documentation

## 2011-08-24 LAB — CBC
Hemoglobin: 12 g/dL (ref 12.0–15.0)
MCHC: 33.7 g/dL (ref 30.0–36.0)
RBC: 4.43 MIL/uL (ref 3.87–5.11)
WBC: 7.4 10*3/uL (ref 4.0–10.5)

## 2011-08-24 LAB — BASIC METABOLIC PANEL
CO2: 28 mEq/L (ref 19–32)
GFR calc non Af Amer: 87 mL/min — ABNORMAL LOW (ref >90)
Glucose, Bld: 96 mg/dL (ref 70–99)
Potassium: 4.1 mEq/L (ref 3.5–5.1)
Sodium: 141 mEq/L (ref 135–145)

## 2011-08-24 MED ORDER — HYDROCODONE-ACETAMINOPHEN 5-325 MG PO TABS: 1.0000 | ORAL_TABLET | Freq: Four times a day (QID) | ORAL | Status: AC | PRN

## 2011-08-24 MED ORDER — SODIUM CHLORIDE 0.9 % IV SOLN
INTRAVENOUS | Status: DC
  Administered 2011-08-24: 18:00:00 via INTRAVENOUS

## 2011-08-24 MED ORDER — CYCLOBENZAPRINE HCL 10 MG PO TABS: 10.0000 mg | ORAL_TABLET | Freq: Two times a day (BID) | ORAL | Status: AC | PRN

## 2011-08-24 MED ORDER — IOHEXOL 300 MG/ML  SOLN
100.0000 mL | Freq: Once | INTRAMUSCULAR | Status: AC | PRN
  Administered 2011-08-24: 100 mL via INTRAVENOUS

## 2011-08-24 MED ORDER — HYDROMORPHONE HCL PF 1 MG/ML IJ SOLN
1.0000 mg | Freq: Once | INTRAMUSCULAR | Status: AC
  Administered 2011-08-24: 1 mg via INTRAVENOUS
  Filled 2011-08-24: qty 1

## 2011-08-24 MED ORDER — ONDANSETRON HCL 4 MG/2ML IJ SOLN
4.0000 mg | Freq: Once | INTRAMUSCULAR | Status: AC
  Administered 2011-08-24: 4 mg via INTRAVENOUS
  Filled 2011-08-24: qty 2

## 2011-08-24 MED ORDER — PROMETHAZINE HCL 25 MG PO TABS: 25.0000 mg | ORAL_TABLET | Freq: Four times a day (QID) | ORAL | Status: AC | PRN

## 2011-08-24 MED ORDER — SODIUM CHLORIDE 0.9 % IV BOLUS (SEPSIS)
250.0000 mL | Freq: Once | INTRAVENOUS | Status: AC
  Administered 2011-08-24: 18:00:00 via INTRAVENOUS
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
## 2011-08-24 NOTE — ED Notes (Signed)
Pt reporting generalized pain in chest and abdomen. Reports some relief following administration of pain medication, however.  No distress noted. VS stable.

## 2011-08-24 NOTE — Discharge Instructions (Signed)
CT scans of head neck chest and abdomen without any significant findings. Take pain medicine antinausea medicine and muscle relaxer medicine as directed. Followup with her primary care Dr. if not improving in 2 days. Return for new or worse symptoms.

## 2011-12-08 ENCOUNTER — Ambulatory Visit (HOSPITAL_COMMUNITY)
Admission: RE | Admit: 2011-12-08 | Discharge: 2011-12-08 | Disposition: A | Discharge status: Home / Self Care | Payer: Medicaid Other | Source: Ambulatory Visit | Attending: Family Medicine | Admitting: Family Medicine

## 2011-12-08 ENCOUNTER — Other Ambulatory Visit (HOSPITAL_COMMUNITY): Payer: Self-pay | Admitting: Family Medicine

## 2011-12-08 DIAGNOSIS — M25519 Pain in unspecified shoulder: Secondary | ICD-10-CM | POA: Insufficient documentation

## 2011-12-16 ENCOUNTER — Other Ambulatory Visit (HOSPITAL_COMMUNITY): Payer: Self-pay | Admitting: Family Medicine

## 2011-12-16 DIAGNOSIS — M25519 Pain in unspecified shoulder: Secondary | ICD-10-CM

## 2011-12-18 ENCOUNTER — Ambulatory Visit (HOSPITAL_COMMUNITY)
Admission: RE | Admit: 2011-12-18 | Discharge: 2011-12-18 | Disposition: A | Discharge status: Home / Self Care | Payer: Medicaid Other | Source: Ambulatory Visit | Attending: Family Medicine | Admitting: Family Medicine

## 2011-12-18 DIAGNOSIS — M25519 Pain in unspecified shoulder: Secondary | ICD-10-CM

## 2012-03-19 ENCOUNTER — Emergency Department (HOSPITAL_COMMUNITY)
Admission: EM | Admit: 2012-03-19 | Discharge: 2012-03-19 | Disposition: A | Discharge status: Home / Self Care | Payer: Medicaid Other | Attending: Emergency Medicine | Admitting: Emergency Medicine

## 2012-03-19 ENCOUNTER — Encounter (HOSPITAL_COMMUNITY): Payer: Self-pay | Admitting: *Deleted

## 2012-03-19 DIAGNOSIS — M25511 Pain in right shoulder: Secondary | ICD-10-CM

## 2012-03-19 DIAGNOSIS — M25519 Pain in unspecified shoulder: Secondary | ICD-10-CM | POA: Insufficient documentation

## 2012-03-19 MED ORDER — DEXAMETHASONE 6 MG PO TABS: ORAL_TABLET | ORAL | Status: DC

## 2012-03-19 MED ORDER — HYDROCODONE-ACETAMINOPHEN 5-325 MG PO TABS: 1.0000 | ORAL_TABLET | ORAL | Status: DC | PRN

## 2012-03-19 NOTE — ED Notes (Signed)
Pt with chronic right shoulder pain that has flared up after cleaning

## 2012-03-19 NOTE — ED Provider Notes (Signed)
History     CSN: [id number]  Arrival date & time 03/19/12  1609   First MD Initiated Contact with Patient 03/19/12 1623      Chief Complaint  Patient presents with  . Shoulder Pain    (Consider location/radiation/quality/duration/timing/severity/associated sxs/prior treatment) Patient is a 41 y.o. female presenting with shoulder pain. The history is provided by the patient.  Shoulder Pain This is a chronic problem. The current episode started more than 1 month ago. The problem occurs daily. The problem has been gradually worsening. Associated symptoms include arthralgias. Pertinent negatives include no abdominal pain, chest pain, coughing, fever or neck pain. Exacerbated by: movement. She has tried acetaminophen for the symptoms. The treatment provided no relief.    Past Medical History  Diagnosis Date  . Diabetes mellitus, type 2 2011    Rx with oral medication  . Hypothyroid     Past Surgical History  Procedure Date  . Cholecystectomy   . Cesarean section   . Total abdominal hysterectomy w/ bilateral salpingoophorectomy   . Larynx surgery     benign mass resected-identity uncertain  . Tonsillectomy     Family History  Problem Relation Age of Onset  . Hypertension Mother   . Hyperlipidemia Mother     History  Substance Use Topics  . Smoking status: Never Smoker   . Smokeless tobacco: Never Used  . Alcohol Use: No    OB History    Grav Para Term Preterm Abortions TAB SAB Ect Mult Living                  Review of Systems  Constitutional: Negative for fever and activity change.       All ROS Neg except as noted in HPI  HENT: Negative for nosebleeds and neck pain.   Eyes: Negative for photophobia and discharge.  Respiratory: Negative for cough, shortness of breath and wheezing.   Cardiovascular: Negative for chest pain and palpitations.  Gastrointestinal: Negative for abdominal pain and blood in stool.  Genitourinary: Negative for dysuria, frequency and  hematuria.  Musculoskeletal: Positive for arthralgias. Negative for back pain.  Skin: Negative.   Neurological: Negative for dizziness, seizures and speech difficulty.  Psychiatric/Behavioral: Negative for hallucinations and confusion.    Allergies  Review of patient's allergies indicates no known allergies.  Home Medications   Current Outpatient Rx  Name Route Sig Dispense Refill  . ASPIRIN EC 81 MG PO TBEC Oral Take 81 mg by mouth every morning.    Marland Kitchen GLIMEPIRIDE 2 MG PO TABS Oral Take 2 mg by mouth every morning.    Marland Kitchen METFORMIN HCL 1000 MG PO TABS Oral Take 1,000 mg by mouth 2 (two) times daily with a meal.      . ADULT MULTIVITAMIN W/MINERALS CH Oral Take 1 tablet by mouth every morning.    Marland Kitchen PANTOPRAZOLE SODIUM 20 MG PO TBEC Oral Take 20 mg by mouth 2 (two) times daily.    . THYROID 90 MG PO TABS Oral Take 90 mg by mouth daily.        BP 128/80  Pulse 100  Temp 98.7 F (37.1 C) (Oral)  Resp 20  Ht 5\' 6"  (1.676 m)  Wt 207 lb (93.895 kg)  BMI 33.41 kg/m2  SpO2 99%  Physical Exam  Nursing note and vitals reviewed. Constitutional: She is oriented to person, place, and time. She appears well-developed and well-nourished.  Non-toxic appearance.  HENT:  Head: Normocephalic.  Right Ear: Tympanic membrane and external ear  normal.  Left Ear: Tympanic membrane and external ear normal.  Eyes: EOM and lids are normal. Pupils are equal, round, and reactive to light.  Neck: Normal range of motion. Neck supple. Carotid bruit is not present.  Cardiovascular: Normal rate, regular rhythm, normal heart sounds, intact distal pulses and normal pulses.   Pulmonary/Chest: Breath sounds normal. No respiratory distress.  Abdominal: Soft. Bowel sounds are normal. There is no tenderness. There is no guarding.  Musculoskeletal: Normal range of motion.       There is pain to palpation of the posterior shoulder down to the level of the scapula on the right. There is anterior pain of the right  shoulder. There is no hot joints appreciated. There is limited range of motion. Is full range of motion of the elbow and full range of motion of the wrist and fingers.  Lymphadenopathy:       Head (right side): No submandibular adenopathy present.       Head (left side): No submandibular adenopathy present.    She has no cervical adenopathy.  Neurological: She is alert and oriented to person, place, and time. She has normal strength. No cranial nerve deficit or sensory deficit.  Skin: Skin is warm and dry.  Psychiatric: She has a normal mood and affect. Her speech is normal.    ED Course  Procedures (including critical care time)  Labs Reviewed - No data to display No results found. Pulse oximetry 99% on room. Within normal limits by my interpretation.  No diagnosis found.    MDM  I have reviewed nursing notes, vital signs, and all appropriate lab and imaging results for this patient. Patient has decreased range of motion of the shoulder. There's been no direct trauma. History of shoulder pain for several months. Patient has not had a formal orthopedic evaluation per the patient. Patient referred to orthopedics. Prescription for Decadron one daily and Norco one every 4 hours given to the patient.       Kathie Dike, [place] 03/19/12 1721

## 2012-03-19 NOTE — ED Notes (Signed)
Patient with no complaints at this time. Respirations even and unlabored. Skin warm/dry. Discharge instructions reviewed with patient at this time. Patient given opportunity to voice concerns/ask questions. Patient discharged at this time and left Emergency Department with steady gait.   

## 2012-03-19 NOTE — ED Notes (Signed)
Pain in R shoulder, non-radiating.  9/10 intensity.  Began today after cleaning.  Unable to raise arm laterally or extend shoulder beyond 90 degrees.

## 2012-03-20 NOTE — ED Provider Notes (Signed)
Medical screening examination/treatment/procedure(s) were performed by non-physician practitioner and as supervising physician I was immediately available for consultation/collaboration.  Donnetta Hutching, MD 03/20/12 [phone]

## 2012-03-22 ENCOUNTER — Emergency Department (HOSPITAL_COMMUNITY)
Admission: EM | Admit: 2012-03-22 | Discharge: 2012-03-22 | Disposition: A | Discharge status: Home / Self Care | Payer: Medicaid Other | Attending: Emergency Medicine | Admitting: Emergency Medicine

## 2012-03-22 ENCOUNTER — Encounter (HOSPITAL_COMMUNITY): Payer: Self-pay | Admitting: Emergency Medicine

## 2012-03-22 DIAGNOSIS — Z8489 Family history of other specified conditions: Secondary | ICD-10-CM | POA: Insufficient documentation

## 2012-03-22 DIAGNOSIS — E119 Type 2 diabetes mellitus without complications: Secondary | ICD-10-CM | POA: Insufficient documentation

## 2012-03-22 DIAGNOSIS — M25519 Pain in unspecified shoulder: Secondary | ICD-10-CM | POA: Insufficient documentation

## 2012-03-22 DIAGNOSIS — Z8249 Family history of ischemic heart disease and other diseases of the circulatory system: Secondary | ICD-10-CM | POA: Insufficient documentation
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
## 2012-03-22 NOTE — ED Notes (Signed)
Patient took vicodin and flexeril last night without any relief
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
## 2012-03-23 NOTE — ED Provider Notes (Signed)
Medical screening examination/treatment/procedure(s) were performed by non-physician practitioner and as supervising physician I was immediately available for consultation/collaboration.   Dione Booze, MD 03/23/12 [phone]

## 2012-07-10 ENCOUNTER — Encounter (HOSPITAL_COMMUNITY): Payer: Self-pay

## 2012-07-10 ENCOUNTER — Emergency Department (HOSPITAL_COMMUNITY)
Admission: EM | Admit: 2012-07-10 | Discharge: 2012-07-10 | Disposition: A | Discharge status: Home / Self Care | Payer: Medicaid Other | Attending: Emergency Medicine | Admitting: Emergency Medicine

## 2012-07-10 DIAGNOSIS — E119 Type 2 diabetes mellitus without complications: Secondary | ICD-10-CM | POA: Insufficient documentation

## 2012-07-10 DIAGNOSIS — T23009A Burn of unspecified degree of unspecified hand, unspecified site, initial encounter: Secondary | ICD-10-CM

## 2012-07-10 DIAGNOSIS — T23109A Burn of first degree of unspecified hand, unspecified site, initial encounter: Secondary | ICD-10-CM | POA: Insufficient documentation

## 2012-07-10 DIAGNOSIS — X19XXXA Contact with other heat and hot substances, initial encounter: Secondary | ICD-10-CM | POA: Insufficient documentation

## 2012-07-10 DIAGNOSIS — Y929 Unspecified place or not applicable: Secondary | ICD-10-CM | POA: Insufficient documentation

## 2012-07-10 DIAGNOSIS — Y93G3 Activity, cooking and baking: Secondary | ICD-10-CM | POA: Insufficient documentation

## 2012-07-10 DIAGNOSIS — E039 Hypothyroidism, unspecified: Secondary | ICD-10-CM | POA: Insufficient documentation

## 2012-07-10 MED ORDER — OXYCODONE-ACETAMINOPHEN 5-325 MG PO TABS: 2.0000 | ORAL_TABLET | ORAL | Status: DC | PRN

## 2012-07-10 MED ORDER — IBUPROFEN 800 MG PO TABS
800.0000 mg | ORAL_TABLET | Freq: Once | ORAL | Status: AC
  Administered 2012-07-10: 800 mg via ORAL
  Filled 2012-07-10: qty 1

## 2012-07-10 MED ORDER — METFORMIN HCL 1000 MG PO TABS: 1000.0000 mg | ORAL_TABLET | Freq: Two times a day (BID) | ORAL | Status: DC

## 2012-07-10 MED ORDER — THYROID 90 MG PO TABS: 90.0000 mg | ORAL_TABLET | Freq: Every day | ORAL | Status: DC

## 2012-07-10 MED ORDER — GLIPIZIDE 5 MG PO TABS: 5.0000 mg | ORAL_TABLET | Freq: Two times a day (BID) | ORAL | Status: DC
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
## 2012-07-10 NOTE — ED Notes (Signed)
Has been out of her medications for 6mos. Does do blood sugars at home and they have ranged in 99-100
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
## 2012-08-15 ENCOUNTER — Other Ambulatory Visit (HOSPITAL_COMMUNITY): Payer: Self-pay | Admitting: Internal Medicine

## 2012-08-15 DIAGNOSIS — Z0000 Encounter for general adult medical examination without abnormal findings: Secondary | ICD-10-CM

## 2012-08-18 ENCOUNTER — Ambulatory Visit (HOSPITAL_COMMUNITY): Payer: Medicaid Other

## 2012-08-18 ENCOUNTER — Ambulatory Visit (HOSPITAL_COMMUNITY)
Admission: RE | Admit: 2012-08-18 | Discharge: 2012-08-18 | Disposition: A | Discharge status: Home / Self Care | Payer: Medicaid Other | Source: Ambulatory Visit | Attending: Internal Medicine | Admitting: Internal Medicine

## 2012-08-18 DIAGNOSIS — Z1231 Encounter for screening mammogram for malignant neoplasm of breast: Secondary | ICD-10-CM | POA: Insufficient documentation

## 2012-08-18 DIAGNOSIS — Z0000 Encounter for general adult medical examination without abnormal findings: Secondary | ICD-10-CM

## 2013-01-11 ENCOUNTER — Emergency Department (HOSPITAL_COMMUNITY): Payer: Medicaid Other

## 2013-01-11 ENCOUNTER — Encounter (HOSPITAL_COMMUNITY): Payer: Self-pay

## 2013-01-11 ENCOUNTER — Emergency Department (HOSPITAL_COMMUNITY)
Admission: EM | Admit: 2013-01-11 | Discharge: 2013-01-11 | Disposition: A | Discharge status: Home / Self Care | Payer: Medicaid Other | Attending: Emergency Medicine | Admitting: Emergency Medicine

## 2013-01-11 DIAGNOSIS — J9801 Acute bronchospasm: Secondary | ICD-10-CM | POA: Insufficient documentation

## 2013-01-11 DIAGNOSIS — E039 Hypothyroidism, unspecified: Secondary | ICD-10-CM | POA: Insufficient documentation

## 2013-01-11 DIAGNOSIS — R05 Cough: Secondary | ICD-10-CM | POA: Insufficient documentation

## 2013-01-11 DIAGNOSIS — Z9889 Other specified postprocedural states: Secondary | ICD-10-CM | POA: Insufficient documentation

## 2013-01-11 DIAGNOSIS — Z79899 Other long term (current) drug therapy: Secondary | ICD-10-CM | POA: Insufficient documentation

## 2013-01-11 DIAGNOSIS — E119 Type 2 diabetes mellitus without complications: Secondary | ICD-10-CM | POA: Insufficient documentation

## 2013-01-11 DIAGNOSIS — R059 Cough, unspecified: Secondary | ICD-10-CM | POA: Insufficient documentation

## 2013-01-11 DIAGNOSIS — R110 Nausea: Secondary | ICD-10-CM | POA: Insufficient documentation

## 2013-01-11 MED ORDER — ALBUTEROL SULFATE HFA 108 (90 BASE) MCG/ACT IN AERS
2.0000 | INHALATION_SPRAY | RESPIRATORY_TRACT | Status: DC
  Administered 2013-01-11: 2 via RESPIRATORY_TRACT
  Filled 2013-01-11: qty 6.7

## 2013-01-11 MED ORDER — ALBUTEROL SULFATE (5 MG/ML) 0.5% IN NEBU
5.0000 mg | INHALATION_SOLUTION | Freq: Once | RESPIRATORY_TRACT | Status: AC
  Administered 2013-01-11: 5 mg via RESPIRATORY_TRACT
  Filled 2013-01-11: qty 1

## 2013-01-11 MED ORDER — IPRATROPIUM BROMIDE 0.02 % IN SOLN
0.5000 mg | Freq: Once | RESPIRATORY_TRACT | Status: AC
  Administered 2013-01-11: 0.5 mg via RESPIRATORY_TRACT
  Filled 2013-01-11: qty 2.5

## 2013-01-11 MED ORDER — RACEPINEPHRINE HCL 2.25 % IN NEBU
0.5000 mL | INHALATION_SOLUTION | Freq: Once | RESPIRATORY_TRACT | Status: AC
  Administered 2013-01-11: 0.5 mL via RESPIRATORY_TRACT
  Filled 2013-01-11: qty 0.5

## 2013-01-11 NOTE — ED Notes (Signed)
Pt reports nonproductive cough since yesterday.  Today started having SOB.  Pt says feels like something in her throat she can't cough up.  PT reports had trache 3 years ago because had a mass in her throat.

## 2013-01-11 NOTE — ED Provider Notes (Signed)
CSN: [id number]     Arrival date & time 01/11/13  1705 History  This chart was scribed for Lyanne Co, MD by Greggory Stallion, ED Scribe. This patient was seen in room [facility] and the patient's care was started at 6:01 PM.   Chief Complaint  Patient presents with  . Shortness of Breath   The history is provided by the patient. No language interpreter was used.    HPI Comments: [name] is a 42 y.o. female who presents to the Emergency Department complaining of gradual onset, intermittent nonproductive cough with associated SOB that started 2 days ago. Pt states it feels like she has something in her throat. She has also been nauseous. Pt denies fever and emesis as associated symptoms. Pt has had a trache in 2010 for a biopsy. Dr. Pollyann Kennedy at [facility] did her surgery. She states there was no cancer found. She does not smoke cigarettes.   Past Medical History  Diagnosis Date  . Diabetes mellitus, type 2 2011    Rx with oral medication  . Hypothyroid    Past Surgical History  Procedure Laterality Date  . Cholecystectomy    . Cesarean section    . Total abdominal hysterectomy w/ bilateral salpingoophorectomy    . Larynx surgery      benign mass resected-identity uncertain  . Tonsillectomy    . Treacheostomy     Family History  Problem Relation Age of Onset  . Hypertension Mother   . Hyperlipidemia Mother    History  Substance Use Topics  . Smoking status: Never Smoker   . Smokeless tobacco: Never Used  . Alcohol Use: No   OB History   Grav Para Term Preterm Abortions TAB SAB Ect Mult Living                 Review of Systems  A complete 10 system review of systems was obtained and all systems are negative except as noted in the HPI and PMH.   Allergies  Review of patient's allergies indicates no known allergies.  Home Medications   Current Outpatient Rx  Name  Route  Sig  Dispense  Refill  . glipiZIDE (GLUCOTROL) 5 MG tablet   Oral   Take 1  tablet (5 mg total) by mouth 2 (two) times daily before a meal.   30 tablet   0   . metFORMIN (GLUCOPHAGE) 1000 MG tablet   Oral   Take 1 tablet (1,000 mg total) by mouth 2 (two) times daily.   30 tablet   0   . oxyCODONE-acetaminophen (PERCOCET/ROXICET) 5-325 MG per tablet   Oral   Take 2 tablets by mouth every 4 (four) hours as needed for pain.   15 tablet   0   . thyroid (ARMOUR THYROID) 90 MG tablet   Oral   Take 1 tablet (90 mg total) by mouth daily.   30 tablet   0    BP 154/95  Pulse 102  Temp(Src) 97.7 F (36.5 C) (Oral)  Resp 24  Ht 5\' 6"  (1.676 m)  Wt 280 lb (127.007 kg)  BMI 45.21 kg/m2  SpO2 98%  Physical Exam  Nursing note and vitals reviewed. Constitutional: She is oriented to person, place, and time. She appears well-developed and well-nourished. No distress.  HENT:  Head: Normocephalic and atraumatic.  Eyes: EOM are normal.  Neck: Normal range of motion.  Harsh upper airway breathsounds. No stridor.   Cardiovascular: Normal rate, regular rhythm and normal heart  sounds.  Exam reveals no gallop and no friction rub.   No murmur heard. Pulmonary/Chest: Effort normal and breath sounds normal. She has no wheezes. She has no rales.  Bilateral breath sounds normal. No rhonchi.   Abdominal: Soft. She exhibits no distension. There is no tenderness.  Musculoskeletal: Normal range of motion.  Neurological: She is alert and oriented to person, place, and time.  Skin: Skin is warm and dry.  Psychiatric: She has a normal mood and affect. Judgment normal.    ED Course   Procedures (including critical care time)  DIAGNOSTIC STUDIES: Oxygen Saturation is 98% on RA, normal by my interpretation.    COORDINATION OF CARE: 6:16 PM-Discussed treatment plan which includes breathing treatment and neck xray with pt at bedside and pt agreed to plan.   7:37 PM-Pt states the breathing treatment did not help at all. Advised pt the neck xray was ordered and they should  come to get her for that soon.   9:26 PM-Upon recheck, pt is feeling much better.  Labs Reviewed - No data to display Dg Neck Soft Tissue  01/11/2013   *RADIOLOGY REPORT*  Clinical Data: Cough, congestion, sore throat  NECK SOFT TISSUES - 1+ VIEW  Comparison: None.  Findings: Single lateral views soft tissue neck submitted.  No prevertebral soft tissue swelling.  Patent nasopharyngeal and cervical airway.  Patent laryngeal airway.  Epiglottis appears normal size.  No radiopaque foreign body.  IMPRESSION: No radiopaque foreign body.  No prevertebral soft tissue swelling. Patent cervical airway.   Original Report Authenticated By: Natasha Mead, M.D.   Dg Chest 2 View  01/11/2013   *RADIOLOGY REPORT*  Clinical Data: Shortness of breath  CHEST - 2 VIEW  Comparison: None.  Findings: The cardiac shadow is at the upper limits of normal in size.  The lungs are clear.  No bony abnormality is seen.  IMPRESSION: No acute abnormality noted.   Original Report Authenticated By: Alcide Clever, M.D.   I personally reviewed the imaging tests through PACS system I reviewed available ER/hospitalization records through the EMR   1. Bronchospasm, acute     MDM  9:33 PM Patient feels much better at this time.  Discharge home in good condition.  This is likely bronchospasm as she improved with albuterol.  Home with an inhaler.        I personally performed the services described in this documentation, which was scribed in my presence. The recorded information has been reviewed and is accurate.     Lyanne Co, MD 01/11/13 [phone]

## 2013-08-24 ENCOUNTER — Encounter (INDEPENDENT_AMBULATORY_CARE_PROVIDER_SITE_OTHER): Payer: Self-pay | Admitting: *Deleted

## 2013-09-13 ENCOUNTER — Encounter (INDEPENDENT_AMBULATORY_CARE_PROVIDER_SITE_OTHER): Payer: Self-pay | Admitting: Internal Medicine

## 2013-09-13 ENCOUNTER — Ambulatory Visit (INDEPENDENT_AMBULATORY_CARE_PROVIDER_SITE_OTHER): Payer: Medicaid Other | Admitting: Internal Medicine

## 2013-09-13 VITALS — BP 108/82 | HR 84 | Ht 66.0 in | Wt 275.6 lb

## 2013-09-13 DIAGNOSIS — K625 Hemorrhage of anus and rectum: Secondary | ICD-10-CM

## 2013-09-13 LAB — CBC
HEMATOCRIT: 40.2 % (ref 36.0–46.0)
HEMOGLOBIN: 14.2 g/dL (ref 12.0–15.0)
MCH: 27.8 pg (ref 26.0–34.0)
MCHC: 35.3 g/dL (ref 30.0–36.0)
MCV: 78.8 fL (ref 78.0–100.0)
Platelets: 248 10*3/uL (ref 150–400)
RBC: 5.1 MIL/uL (ref 3.87–5.11)
RDW: 13.8 % (ref 11.5–15.5)
WBC: 8.3 10*3/uL (ref 4.0–10.5)

## 2013-09-13 NOTE — Patient Instructions (Signed)
Three stools cards sent with patient. If any further bleeding, she will need a colonoscopy

## 2013-09-13 NOTE — Progress Notes (Signed)
Subjective:     Patient ID: [name], female   DOB: 08/10/1970, 43 y.o.   MRN: [id number]  HPI Referred to our office by Lendell Caprice for blood in stool/abdominal pain. Three weeks ago she went to [employer]. She ate BBQ. Afterwards she had some lower abdominal pain and then she had a BM. She saw blood on the toilet paper. She has not seen any since then. She has rt upper quadrant pain off and on since she had her gallbladder taken out. She underwent a cholecystectomy for rt upper quadrant pain. She did have complications after the GB surgery.  She had a bile leak post cholecystectomy and had a biliary stent placed.  Esophagogastroduodenoscopy with biliary stent removal.  Stent was later removed. Appetite is good. Weight loss which is intentional.  She has rt upper quadrant.  No change in stool. No melena or bright red rectal bleeding recently. No family hx of colon cancer.     She was guaiac negative in office on 08/09/2013.   Review of Systems Past Medical History  Diagnosis Date  . Diabetes mellitus, type 2 2011    Rx with oral medication  . Hypothyroid     Past Surgical History  Procedure Laterality Date  . Cholecystectomy    . Cesarean section    . Total abdominal hysterectomy w/ bilateral salpingoophorectomy    . Larynx surgery      benign mass resected-identity uncertain  . Tonsillectomy    . Treacheostomy      No Known Allergies  Current Outpatient Prescriptions on File Prior to Visit  Medication Sig Dispense Refill  . metFORMIN (GLUCOPHAGE) 1000 MG tablet Take 1 tablet (1,000 mg total) by mouth 2 (two) times daily.  30 tablet  0   No current facility-administered medications on file prior to visit.    Past Medical History  Diagnosis Date  . Diabetes mellitus, type 2 2011    Rx with oral medication  . Hypothyroid     Past Surgical History  Procedure Laterality Date  . Cholecystectomy    . Cesarean section    . Total abdominal hysterectomy w/  bilateral salpingoophorectomy    . Larynx surgery      benign mass resected-identity uncertain  . Tonsillectomy    . Treacheostomy      No Known Allergies  Current Outpatient Prescriptions on File Prior to Visit  Medication Sig Dispense Refill  . metFORMIN (GLUCOPHAGE) 1000 MG tablet Take 1 tablet (1,000 mg total) by mouth 2 (two) times daily.  30 tablet  0   No current facility-administered medications on file prior to visit.   Separated, three children in good health. RN at [employer]       Objective:   Physical Exam  Filed Vitals:   09/13/13 1453  BP: 108/82  Pulse: 84  Height: 5\' 6"  (1.676 m)  Weight: 275 lb 9.6 oz (125.011 kg)  Alert and oriented. Skin warm and dry. Oral mucosa is moist.   . Sclera anicteric, conjunctivae is pink. Thyroid not enlarged. No cervical lymphadenopathy. Lungs clear. Heart regular rate and rhythm.  Abdomen is soft. Bowel sounds are positive. No hepatomegaly. No abdominal masses felt. Some tenderness rt mid abdomen.. Slight edema to lower extremities.       Assessment:    One episode of rectal bleeding noted on toilet tissue. No other symptoms.     Plan:    Stool cards x 3 given to patient,. CBC. If she has any further  rectal bleeding, will consider colonoscopy.

## 2013-11-02 ENCOUNTER — Other Ambulatory Visit (HOSPITAL_COMMUNITY): Payer: Self-pay | Admitting: Internal Medicine

## 2013-11-02 DIAGNOSIS — K769 Liver disease, unspecified: Secondary | ICD-10-CM

## 2013-11-06 ENCOUNTER — Other Ambulatory Visit (HOSPITAL_COMMUNITY): Payer: Self-pay | Admitting: Internal Medicine

## 2013-11-06 ENCOUNTER — Ambulatory Visit (HOSPITAL_COMMUNITY)
Admission: RE | Admit: 2013-11-06 | Discharge: 2013-11-06 | Disposition: A | Discharge status: Home / Self Care | Payer: Medicaid Other | Source: Ambulatory Visit | Attending: Internal Medicine | Admitting: Internal Medicine

## 2013-11-06 DIAGNOSIS — Q447 Other congenital malformations of liver: Principal | ICD-10-CM

## 2013-11-06 DIAGNOSIS — Q441 Other congenital malformations of gallbladder: Secondary | ICD-10-CM | POA: Diagnosis not present

## 2013-11-06 DIAGNOSIS — R109 Unspecified abdominal pain: Secondary | ICD-10-CM | POA: Diagnosis present

## 2013-11-06 DIAGNOSIS — Q445 Other congenital malformations of bile ducts: Principal | ICD-10-CM

## 2013-11-06 DIAGNOSIS — Z1231 Encounter for screening mammogram for malignant neoplasm of breast: Secondary | ICD-10-CM

## 2013-11-06 DIAGNOSIS — Q4479 Other congenital malformations of liver: Secondary | ICD-10-CM | POA: Insufficient documentation

## 2013-11-06 DIAGNOSIS — K769 Liver disease, unspecified: Secondary | ICD-10-CM

## 2013-11-09 ENCOUNTER — Ambulatory Visit (HOSPITAL_COMMUNITY)
Admission: RE | Admit: 2013-11-09 | Discharge: 2013-11-09 | Disposition: A | Discharge status: Home / Self Care | Payer: Medicaid Other | Source: Ambulatory Visit | Attending: Internal Medicine | Admitting: Internal Medicine

## 2013-11-09 DIAGNOSIS — Z1231 Encounter for screening mammogram for malignant neoplasm of breast: Secondary | ICD-10-CM

## 2013-11-21 ENCOUNTER — Encounter (INDEPENDENT_AMBULATORY_CARE_PROVIDER_SITE_OTHER): Payer: Self-pay | Admitting: *Deleted

## 2013-11-23 ENCOUNTER — Ambulatory Visit (INDEPENDENT_AMBULATORY_CARE_PROVIDER_SITE_OTHER): Payer: Medicaid Other | Admitting: Internal Medicine

## 2013-11-23 ENCOUNTER — Encounter (INDEPENDENT_AMBULATORY_CARE_PROVIDER_SITE_OTHER): Payer: Self-pay | Admitting: Internal Medicine

## 2013-11-23 VITALS — BP 132/78 | HR 68 | Temp 97.7°F | Ht 66.0 in | Wt 269.6 lb

## 2013-11-23 DIAGNOSIS — R7401 Elevation of levels of liver transaminase levels: Secondary | ICD-10-CM | POA: Diagnosis not present

## 2013-11-23 DIAGNOSIS — R740 Nonspecific elevation of levels of transaminase and lactic acid dehydrogenase [LDH]: Principal | ICD-10-CM

## 2013-11-23 NOTE — Patient Instructions (Signed)
Hepatic function today. If you have acute rt upper quadrant pain, call our office and we will repeat the numbers

## 2013-11-23 NOTE — Progress Notes (Signed)
Subjective:     Patient ID: [name], female   DOB: May 07, 1971, 43 y.o.   MRN: [id number]  HPI  Referred to our office by Dr. Sherwood [facility] elevated liver enzymes and abnormal US.  She actually was seen for routine physical. Noted her Liver enzymes slightly elevated. She cannot tell me if they have been elevated in the past. Appetite is good. She has weight loss of 20 pounds unintentional. She says her BS were out of control. No dysphagia or acid reflux. She says she has rt upper quadrant pain and rt lower quadrant pain. She describes as a dull ache. Worse after working. She says she has the pain since she had her GB removed in 2009. The same pain she had when she had her GB removed. Diagnosed with Biliary Dyskinesia. . She did not have stones. Post she developed a leak and ended up with a biliary stent. BMs are normal. No melena or bright rectal bleeding.  11/06/2013 US abdomen complete: IMPRESSION:  Common bile duct is dilated 11mm, new from 08/24/2011 (8mm) . If further  evaluation is desired, MR abdomen without and with contrast with  MRCP could be performed in further evaluation, as clinically  indicated.  10/31/2013 ALP 78, AST 40, ALT 53,  WBC 7.1, RBC 5.22, H and H 14.5 and 42.0. Hep B Surface Antigen Negative Hep C Antibody Negative.  Review of Systems see hpi Past Medical History  Diagnosis Date  . Diabetes mellitus, type 2 2011    Rx with oral medication  . Hypothyroid     Past Surgical History  Procedure Laterality Date  . Cholecystectomy    . Cesarean section    . Total abdominal hysterectomy w/ bilateral salpingoophorectomy    . Larynx surgery      benign mass resected-identity uncertain  . Tonsillectomy    . Treacheostomy      No Known Allergies  Current Outpatient Prescriptions on File Prior to Visit  Medication Sig Dispense Refill  . glimepiride (AMARYL) 2 MG tablet Take 4 mg by mouth daily with breakfast.       . thyroid (ARMOUR) 90 MG  tablet Take 90 mg by mouth daily. And 15mcg       No current facility-administered medications on file prior to visit.         Objective:   Physical Exam  Filed Vitals:   11/23/13 0935  BP: 132/78  Pulse: 68  Temp: 97.7 F (36.5 C)  Height: 5\' 6"  (1.676 m)  Weight: 269 lb 9.6 oz (122.29 kg)   Alert and oriented. Skin warm and dry. Oral mucosa is moist.   . Sclera anicteric, conjunctivae is pink. Thyroid not enlarged. No cervical lymphadenopathy. Lungs clear. Heart regular rate and rhythm.  Abdomen is soft. Bowel sounds are positive. No hepatomegaly. No abdominal masses felt. Slight tenderness rt upper quadrant and rt lower quadrant.   No edema to lower extremities.      Assessment:    Elevated transaminases. Rt upper quadrant tenderness which is not new.  CBD 11 from 8 in 2013. I discussed this case with Dr. Karilyn Cotaehman.    Plan:     Hepatic function today. OV in one month. If you have more acute pain in the near future, call our office and we will repeat Hepatic function.

## 2013-11-24 LAB — HEPATIC FUNCTION PANEL
ALT: 53 U/L — ABNORMAL HIGH (ref 0–35)
AST: 39 U/L — AB (ref 0–37)
Albumin: 4 g/dL (ref 3.5–5.2)
Alkaline Phosphatase: 67 U/L (ref 39–117)
BILIRUBIN DIRECT: 0.1 mg/dL (ref 0.0–0.3)
BILIRUBIN INDIRECT: 0.3 mg/dL (ref 0.2–1.2)
BILIRUBIN TOTAL: 0.4 mg/dL (ref 0.2–1.2)
Total Protein: 7.5 g/dL (ref 6.0–8.3)

## 2013-11-27 ENCOUNTER — Telehealth (INDEPENDENT_AMBULATORY_CARE_PROVIDER_SITE_OTHER): Payer: Self-pay | Admitting: Internal Medicine

## 2013-11-27 DIAGNOSIS — K838 Other specified diseases of biliary tract: Secondary | ICD-10-CM

## 2013-11-27 NOTE — Telephone Encounter (Signed)
PA request submitted to [facility]

## 2013-11-27 NOTE — Telephone Encounter (Signed)
MR abdomen with and without constrast.

## 2013-11-29 NOTE — Telephone Encounter (Signed)
MRI sch'd 12/04/13 at 200 (130), npo after 8 am, patient aware

## 2013-12-04 ENCOUNTER — Telehealth (INDEPENDENT_AMBULATORY_CARE_PROVIDER_SITE_OTHER): Payer: Self-pay | Admitting: Internal Medicine

## 2013-12-04 ENCOUNTER — Ambulatory Visit (HOSPITAL_COMMUNITY)
Admission: RE | Admit: 2013-12-04 | Discharge: 2013-12-04 | Disposition: A | Discharge status: Home / Self Care | Payer: Medicaid Other | Source: Ambulatory Visit | Attending: Internal Medicine | Admitting: Internal Medicine

## 2013-12-04 DIAGNOSIS — K838 Other specified diseases of biliary tract: Secondary | ICD-10-CM

## 2013-12-04 NOTE — Telephone Encounter (Signed)
Reorder. Need pre cert

## 2013-12-05 ENCOUNTER — Telehealth (INDEPENDENT_AMBULATORY_CARE_PROVIDER_SITE_OTHER): Payer: Self-pay | Admitting: Internal Medicine

## 2013-12-05 DIAGNOSIS — K838 Other specified diseases of biliary tract: Secondary | ICD-10-CM

## 2013-12-05 NOTE — Telephone Encounter (Signed)
MRCP with MR abdomen with and without contrast

## 2013-12-11 NOTE — Telephone Encounter (Signed)
Error, ordered on wrong ppatient

## 2013-12-11 NOTE — Telephone Encounter (Signed)
MRCP/MR abd w/ w/out sch'd 12/15/13 @ 10:00 (945), npi 4 hours, left detailed message for patient

## 2013-12-11 NOTE — Addendum Note (Signed)
Addended by: Len BlalockSETZER, Freddi Schrager L on: 12/11/2013 10:02 AM   Modules accepted: Orders

## 2013-12-15 ENCOUNTER — Ambulatory Visit (HOSPITAL_COMMUNITY): Admission: RE | Admit: 2013-12-15 | Payer: Medicaid Other | Source: Ambulatory Visit

## 2013-12-19 ENCOUNTER — Ambulatory Visit (HOSPITAL_COMMUNITY)
Admission: RE | Admit: 2013-12-19 | Discharge: 2013-12-19 | Disposition: A | Discharge status: Home / Self Care | Payer: Medicaid Other | Source: Ambulatory Visit | Attending: Internal Medicine | Admitting: Internal Medicine

## 2013-12-19 DIAGNOSIS — K838 Other specified diseases of biliary tract: Secondary | ICD-10-CM

## 2013-12-25 ENCOUNTER — Encounter (INDEPENDENT_AMBULATORY_CARE_PROVIDER_SITE_OTHER): Payer: Self-pay | Admitting: Internal Medicine

## 2013-12-25 ENCOUNTER — Ambulatory Visit (INDEPENDENT_AMBULATORY_CARE_PROVIDER_SITE_OTHER): Payer: Medicaid Other | Admitting: Internal Medicine

## 2013-12-25 VITALS — BP 112/82 | HR 64 | Temp 97.8°F | Ht 66.0 in | Wt 275.8 lb

## 2013-12-25 DIAGNOSIS — K760 Fatty (change of) liver, not elsewhere classified: Secondary | ICD-10-CM | POA: Insufficient documentation

## 2013-12-25 DIAGNOSIS — K7689 Other specified diseases of liver: Secondary | ICD-10-CM

## 2013-12-25 LAB — HEPATIC FUNCTION PANEL
ALBUMIN: 3.9 g/dL (ref 3.5–5.2)
ALK PHOS: 77 U/L (ref 39–117)
ALT: 33 U/L (ref 0–35)
AST: 18 U/L (ref 0–37)
BILIRUBIN TOTAL: 0.3 mg/dL (ref 0.2–1.2)
Total Protein: 7.3 g/dL (ref 6.0–8.3)

## 2013-12-25 NOTE — Progress Notes (Addendum)
Subjective:     Patient ID: [name], female   DOB: [birth year], 43 y.o.   MRN: [id number]  HPI Here today for f/u of her elevated liver enzymes and abnormal [place]. She cannot tell me if her liver enzymes have ever been elevated in the past.  Per Epic, liver enzymes have been elevated at least thru 2009. Underwent a lap. Chole. In 2009 for biliary dyskinesia.   She did not have stones. Post oop she developed a leak and ended up with a biliary stent.  She tells me she continues to have rt upper quadrant pain to her rt lower quadrant.  Pain rt lower quadrant for a couple of weeks. Sometimes she will have nausea with the pain.  She tells me sometimes when she is at work and moving around a lot and not able to take a break, the pain worsens.  She has had this pain since 2009 for biliary dyskinsia.  Appetite is okay. She has gained almost 7 pounds since her last visit.  No dysphagia or acid reflux.   BMs are normal. No melena or bright rectal bleeding.    MR MRCP 12/23/2013: Fatty liver, No biliary duct dilatation identified. No filling defects identified suggestive of biliary duct stones.  CBD 4.17mm  11/06/2013 US abdomen complete:  IMPRESSION:  Common bile duct is dilated 11mm, new from 08/24/2011 (8mm) . If further  evaluation is desired, MR abdomen without and with contrast with  MRCP could be performed in further evaluation, as clinically  indicated.  10/31/2013 ALP 78, AST 40, ALT 53,  WBC 7.1, RBC 5.22, H and H 14.5 and 42.0.  Hep B Surface Antigen Negative  Hep C Antibody Negative.   Review of Systems Past Medical History  Diagnosis Date  . Diabetes mellitus, type 2 2011    Rx with oral medication  . Hypothyroid     Past Surgical History  Procedure Laterality Date  . Cesarean section    . Total abdominal hysterectomy w/ bilateral salpingoophorectomy    . Larynx surgery      benign mass resected-identity uncertain  . Tonsillectomy    . Treacheostomy    .  Cholecystectomy      2007    No Known Allergies  Current Outpatient Prescriptions on File Prior to Visit  Medication Sig Dispense Refill  . glimepiride (AMARYL) 2 MG tablet Take 4 mg by mouth daily with breakfast.       . metFORMIN (GLUCOPHAGE) 1000 MG tablet Take 500 mg by mouth. Three tabs in am and 2 in the evening      . thyroid (ARMOUR) 90 MG tablet Take 90 mg by mouth daily. And       No current facility-administered medications on file prior to visit.        Objective:   Physical Exam  Filed Vitals:   12/25/13 0922  BP: 112/82  Pulse: 64  Temp: 97.8 F (36.6 C)  Height: 5\' 6"  (1.676 m)  Weight: 275 lb 12.8 oz (125.102 kg)  Alert and oriented. Skin warm and dry. Oral mucosa is moist.   . Sclera anicteric, conjunctivae is pink. Thyroid not enlarged. No cervical lymphadenopathy. Lungs clear. Heart regular rate and rhythm.  Abdomen is soft. Abdomen is obese. Bowel sounds are positive. No hepatomegaly. No abdominal masses felt. Slight rt upper  tenderness.  No edema to lower extremities.        Assessment:     Rt upper quadrant pain. Recent MR/MRCP normal.  Liver enzymes slightly elevated. Same pain as when she had her gallbladder out in 2009.  Fatty liver. Patient is grossly overweight.    Plan:    Hepatic function today. Diet and exercise. OV  With Dr. Karilyn Cotaehman in 3 months. If pain worsens, call our office and we will repeat Hepatic function.

## 2013-12-25 NOTE — Patient Instructions (Signed)
OV in 3 months with Dr. Karilyn Cotaehman.  Hepatic function

## 2014-01-03 ENCOUNTER — Encounter (HOSPITAL_COMMUNITY): Payer: Self-pay | Admitting: Emergency Medicine

## 2014-01-03 ENCOUNTER — Emergency Department (HOSPITAL_COMMUNITY)
Admission: EM | Admit: 2014-01-03 | Discharge: 2014-01-04 | Disposition: A | Discharge status: Home / Self Care | Payer: Medicaid Other | Attending: Emergency Medicine | Admitting: Emergency Medicine

## 2014-01-03 DIAGNOSIS — R109 Unspecified abdominal pain: Secondary | ICD-10-CM | POA: Diagnosis present

## 2014-01-03 DIAGNOSIS — Z9889 Other specified postprocedural states: Secondary | ICD-10-CM | POA: Diagnosis not present

## 2014-01-03 DIAGNOSIS — E039 Hypothyroidism, unspecified: Secondary | ICD-10-CM | POA: Diagnosis not present

## 2014-01-03 DIAGNOSIS — E119 Type 2 diabetes mellitus without complications: Secondary | ICD-10-CM | POA: Insufficient documentation

## 2014-01-03 DIAGNOSIS — Z79899 Other long term (current) drug therapy: Secondary | ICD-10-CM | POA: Diagnosis not present

## 2014-01-03 LAB — LIPASE, BLOOD: LIPASE: 23 U/L (ref 11–59)

## 2014-01-03 LAB — CBC WITH DIFFERENTIAL/PLATELET
BASOS ABS: 0.1 10*3/uL (ref 0.0–0.1)
Basophils Relative: 1 % (ref 0–1)
EOS PCT: 5 % (ref 0–5)
Eosinophils Absolute: 0.4 10*3/uL (ref 0.0–0.7)
HEMATOCRIT: 39.8 % (ref 36.0–46.0)
HEMOGLOBIN: 13.4 g/dL (ref 12.0–15.0)
LYMPHS PCT: 45 % (ref 12–46)
Lymphs Abs: 3.7 10*3/uL (ref 0.7–4.0)
MCH: 27.6 pg (ref 26.0–34.0)
MCHC: 33.7 g/dL (ref 30.0–36.0)
MCV: 82.1 fL (ref 78.0–100.0)
MONO ABS: 0.7 10*3/uL (ref 0.1–1.0)
MONOS PCT: 8 % (ref 3–12)
Neutro Abs: 3.4 10*3/uL (ref 1.7–7.7)
Neutrophils Relative %: 41 % — ABNORMAL LOW (ref 43–77)
Platelets: 224 10*3/uL (ref 150–400)
RBC: 4.85 MIL/uL (ref 3.87–5.11)
RDW: 13.5 % (ref 11.5–15.5)
WBC: 8.2 10*3/uL (ref 4.0–10.5)

## 2014-01-03 LAB — URINALYSIS, ROUTINE W REFLEX MICROSCOPIC
Bilirubin Urine: NEGATIVE
Glucose, UA: 250 mg/dL — AB
Hgb urine dipstick: NEGATIVE
Ketones, ur: NEGATIVE mg/dL
LEUKOCYTES UA: NEGATIVE
NITRITE: NEGATIVE
PROTEIN: NEGATIVE mg/dL
Specific Gravity, Urine: 1.026 (ref 1.005–1.030)
UROBILINOGEN UA: 0.2 mg/dL (ref 0.0–1.0)
pH: 5.5 (ref 5.0–8.0)

## 2014-01-03 LAB — COMPREHENSIVE METABOLIC PANEL
ALT: 45 U/L — ABNORMAL HIGH (ref 0–35)
ANION GAP: 10 (ref 5–15)
AST: 35 U/L (ref 0–37)
Albumin: 3.7 g/dL (ref 3.5–5.2)
Alkaline Phosphatase: 73 U/L (ref 39–117)
BILIRUBIN TOTAL: 0.3 mg/dL (ref 0.3–1.2)
BUN: 9 mg/dL (ref 6–23)
CALCIUM: 8.8 mg/dL (ref 8.4–10.5)
CO2: 27 meq/L (ref 19–32)
CREATININE: 0.71 mg/dL (ref 0.50–1.10)
Chloride: 105 mEq/L (ref 96–112)
GFR calc non Af Amer: >90 mL/min (ref >90)
GLUCOSE: 169 mg/dL — AB (ref 70–99)
Potassium: 4.4 mEq/L (ref 3.7–5.3)
Sodium: 142 mEq/L (ref 137–147)
Total Protein: 8 g/dL (ref 6.0–8.3)

## 2014-01-03 NOTE — ED Notes (Signed)
The pt is c/o rt flank pain with nv since Monday.  No temp diarrhea and nausea only.  lmp none hys   The pt has not had any uirinary frequency or bloody urine.  No previous history

## 2014-01-04 ENCOUNTER — Encounter (HOSPITAL_COMMUNITY): Payer: Self-pay | Admitting: Radiology

## 2014-01-04 ENCOUNTER — Emergency Department (HOSPITAL_COMMUNITY): Payer: Medicaid Other

## 2014-01-04 MED ORDER — ONDANSETRON HCL 4 MG/2ML IJ SOLN
4.0000 mg | Freq: Once | INTRAMUSCULAR | Status: AC
  Administered 2014-01-04: 4 mg via INTRAVENOUS
  Filled 2014-01-04: qty 2

## 2014-01-04 MED ORDER — OXYCODONE-ACETAMINOPHEN 5-325 MG PO TABS: 1.0000 | ORAL_TABLET | ORAL | Status: AC | PRN

## 2014-01-04 MED ORDER — HYDROMORPHONE HCL PF 1 MG/ML IJ SOLN
1.0000 mg | Freq: Once | INTRAMUSCULAR | Status: AC
  Administered 2014-01-04: 1 mg via INTRAVENOUS
  Filled 2014-01-04: qty 1

## 2014-01-04 MED ORDER — ONDANSETRON HCL 4 MG PO TABS: 4.0000 mg | ORAL_TABLET | Freq: Four times a day (QID) | ORAL | Status: AC | PRN

## 2014-01-04 MED ORDER — IOHEXOL 300 MG/ML  SOLN: 25.0000 mL | INTRAMUSCULAR | Status: AC

## 2014-01-04 MED ORDER — IOHEXOL 300 MG/ML  SOLN
100.0000 mL | Freq: Once | INTRAMUSCULAR | Status: AC | PRN
  Administered 2014-01-04: 100 mL via INTRAVENOUS

## 2014-01-04 MED ORDER — SODIUM CHLORIDE 0.9 % IV SOLN
1000.0000 mL | INTRAVENOUS | Status: DC
  Administered 2014-01-04: 1000 mL via INTRAVENOUS

## 2014-01-04 MED ORDER — SODIUM CHLORIDE 0.9 % IV SOLN
1000.0000 mL | Freq: Once | INTRAVENOUS | Status: AC
  Administered 2014-01-04: 1000 mL via INTRAVENOUS

## 2014-01-04 NOTE — ED Notes (Signed)
C/O increased nausea.  MD notified, order rec'd.

## 2014-01-04 NOTE — ED Notes (Signed)
CT notified pt finished drinking contrast  
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
## 2014-01-04 NOTE — Discharge Instructions (Signed)
Abdominal Pain °Many things can cause abdominal pain. Usually, abdominal pain is not caused by a disease and will improve without treatment. It can often be observed and treated at home. Your health care provider will do a physical exam and possibly order blood tests and X-rays to help determine the seriousness of your pain. However, in many cases, more time must pass before a clear cause of the pain can be found. Before that point, your health care provider may not know if you need more testing or further treatment. °HOME CARE INSTRUCTIONS  °Monitor your abdominal pain for any changes. The following actions may help to alleviate any discomfort you are experiencing: °· Only take over-the-counter or prescription medicines as directed by your health care provider. °· Do not take laxatives unless directed to do so by your health care provider. °· Try a clear liquid diet (broth, tea, or water) as directed by your health care provider. Slowly move to a bland diet as tolerated. °SEEK MEDICAL CARE IF: °· You have unexplained abdominal pain. °· You have abdominal pain associated with nausea or diarrhea. °· You have pain when you urinate or have a bowel movement. °· You experience abdominal pain that wakes you in the night. °· You have abdominal pain that is worsened or improved by eating food. °· You have abdominal pain that is worsened with eating fatty foods. °· You have a fever. °SEEK IMMEDIATE MEDICAL CARE IF:  °· Your pain does not go away within 2 hours. °· You keep throwing up (vomiting). °· Your pain is felt only in portions of the abdomen, such as the right side or the left lower portion of the abdomen. °· You pass bloody or black tarry stools. °MAKE SURE YOU: °· Understand these instructions.   °· Will watch your condition.   °· Will get help right away if you are not doing well or get worse.   °Document Released: 03/04/2005 Document Revised: 05/30/2013 Document Reviewed: 02/01/2013 °[employer]® Patient Information  ©2015 [employer]. This information is not intended to replace advice given to you by your health care provider. Make sure you discuss any questions you have with your health care provider. ° °Acetaminophen; Oxycodone tablets °What is this medicine? °ACETAMINOPHEN; OXYCODONE (a set a MEE noe fen; ox i KOE done) is a pain reliever. It is used to treat mild to moderate pain. °This medicine may be used for other purposes; ask your health care provider or pharmacist if you have questions. °COMMON BRAND NAME(S): Endocet, Magnacet, Narvox, Percocet, Perloxx, Primalev, Primlev, Roxicet, Xolox °What should I tell my health care provider before I take this medicine? °They need to know if you have any of these conditions: °-brain tumor °-Crohn's disease, inflammatory bowel disease, or ulcerative colitis °-drug abuse or addiction °-head injury °-heart or circulation problems °-if you often drink alcohol °-kidney disease or problems going to the bathroom °-liver disease °-lung disease, asthma, or breathing problems °-an unusual or allergic reaction to acetaminophen, oxycodone, other opioid analgesics, other medicines, foods, dyes, or preservatives °-pregnant or trying to get pregnant °-breast-feeding °How should I use this medicine? °Take this medicine by mouth with a full glass of water. Follow the directions on the prescription label. Take your medicine at regular intervals. Do not take your medicine more often than directed. °Talk to your pediatrician regarding the use of this medicine in children. Special care may be needed. °Patients over 65 years old may have a stronger reaction and need a smaller dose. °Overdosage: If you think you have   taken too much of this medicine contact a poison control center or emergency room at once. °NOTE: This medicine is only for you. Do not share this medicine with others. °What if I miss a dose? °If you miss a dose, take it as soon as you can. If it is almost time for your next dose, take  only that dose. Do not take double or extra doses. °What may interact with this medicine? °-alcohol °-antihistamines °-barbiturates like amobarbital, butalbital, butabarbital, methohexital, pentobarbital, phenobarbital, thiopental, and secobarbital °-benztropine °-drugs for bladder problems like solifenacin, trospium, oxybutynin, tolterodine, hyoscyamine, and methscopolamine °-drugs for breathing problems like ipratropium and tiotropium °-drugs for certain stomach or intestine problems like propantheline, homatropine methylbromide, glycopyrrolate, atropine, belladonna, and dicyclomine °-general anesthetics like etomidate, ketamine, nitrous oxide, propofol, desflurane, enflurane, halothane, isoflurane, and sevoflurane °-medicines for depression, anxiety, or psychotic disturbances °-medicines for sleep °-muscle relaxants °-naltrexone °-narcotic medicines (opiates) for pain °-phenothiazines like perphenazine, thioridazine, chlorpromazine, mesoridazine, fluphenazine, prochlorperazine, promazine, and trifluoperazine °-scopolamine °-tramadol °-trihexyphenidyl °This list may not describe all possible interactions. Give your health care provider a list of all the medicines, herbs, non-prescription drugs, or dietary supplements you use. Also tell them if you smoke, drink alcohol, or use illegal drugs. Some items may interact with your medicine. °What should I watch for while using this medicine? °Tell your doctor or health care professional if your pain does not go away, if it gets worse, or if you have new or a different type of pain. You may develop tolerance to the medicine. Tolerance means that you will need a higher dose of the medication for pain relief. Tolerance is normal and is expected if you take this medicine for a long time. °Do not suddenly stop taking your medicine because you may develop a severe reaction. Your body becomes used to the medicine. This does NOT mean you are addicted. Addiction is a behavior  related to getting and using a drug for a non-medical reason. If you have pain, you have a medical reason to take pain medicine. Your doctor will tell you how much medicine to take. If your doctor wants you to stop the medicine, the dose will be slowly lowered over time to avoid any side effects. °You may get drowsy or dizzy. Do not drive, use machinery, or do anything that needs mental alertness until you know how this medicine affects you. Do not stand or sit up quickly, especially if you are an older patient. This reduces the risk of dizzy or fainting spells. Alcohol may interfere with the effect of this medicine. Avoid alcoholic drinks. °There are different types of narcotic medicines (opiates) for pain. If you take more than one type at the same time, you may have more side effects. Give your health care provider a list of all medicines you use. Your doctor will tell you how much medicine to take. Do not take more medicine than directed. Call emergency for help if you have problems breathing. °The medicine will cause constipation. Try to have a bowel movement at least every 2 to 3 days. If you do not have a bowel movement for 3 days, call your doctor or health care professional. °Do not take Tylenol (acetaminophen) or medicines that have acetaminophen with this medicine. Too much acetaminophen can be very dangerous. Many nonprescription medicines contain acetaminophen. Always read the labels carefully to avoid taking more acetaminophen. °What side effects may I notice from receiving this medicine? °Side effects that you should report to your doctor or health care   professional as soon as possible: °-allergic reactions like skin rash, itching or hives, swelling of the face, lips, or tongue °-breathing difficulties, wheezing °-confusion °-light headedness or fainting spells °-severe stomach pain °-unusually weak or tired °-yellowing of the skin or the whites of the eyes °Side effects that usually do not require  medical attention (report to your doctor or health care professional if they continue or are bothersome): °-dizziness °-drowsiness °-nausea °-vomiting °This list may not describe all possible side effects. Call your doctor for medical advice about side effects. You may report side effects to FDA at 1-800-FDA-1088. °Where should I keep my medicine? °Keep out of the reach of children. This medicine can be abused. Keep your medicine in a safe place to protect it from theft. Do not share this medicine with anyone. Selling or giving away this medicine is dangerous and against the law. °Store at room temperature between 20 and 25 degrees C (68 and 77 degrees F). Keep container tightly closed. Protect from light. °This medicine may cause accidental overdose and death if it is taken by other adults, children, or pets. Flush any unused medicine down the toilet to reduce the chance of harm. Do not use the medicine after the expiration date. °NOTE: This sheet is a summary. It may not cover all possible information. If you have questions about this medicine, talk to your doctor, pharmacist, or health care provider. °© 2015, [employer]. (2013-01-16 13:17:35) ° °Ondansetron tablets °What is this medicine? °ONDANSETRON (on DAN se tron) is used to treat nausea and vomiting caused by chemotherapy. It is also used to prevent or treat nausea and vomiting after surgery. °This medicine may be used for other purposes; ask your health care provider or pharmacist if you have questions. °COMMON BRAND NAME(S): Zofran °What should I tell my health care provider before I take this medicine? °They need to know if you have any of these conditions: °-heart disease °-history of irregular heartbeat °-liver disease °-low levels of magnesium or potassium in the blood °-an unusual or allergic reaction to ondansetron, granisetron, other medicines, foods, dyes, or preservatives °-pregnant or trying to get pregnant °-breast-feeding °How  should I use this medicine? °Take this medicine by mouth with a glass of water. Follow the directions on your prescription label. Take your doses at regular intervals. Do not take your medicine more often than directed. °Talk to your pediatrician regarding the use of this medicine in children. Special care may be needed. °Overdosage: If you think you have taken too much of this medicine contact a poison control center or emergency room at once. °NOTE: This medicine is only for you. Do not share this medicine with others. °What if I miss a dose? °If you miss a dose, take it as soon as you can. If it is almost time for your next dose, take only that dose. Do not take double or extra doses. °What may interact with this medicine? °Do not take this medicine with any of the following medications: °-apomorphine °-certain medicines for fungal infections like fluconazole, itraconazole, ketoconazole, posaconazole, voriconazole °-cisapride °-dofetilide °-dronedarone °-pimozide °-thioridazine °-ziprasidone °This medicine may also interact with the following medications: °-carbamazepine °-certain medicines for depression, anxiety, or psychotic disturbances °-fentanyl °-linezolid °-MAOIs like Carbex, Eldepryl, Marplan, Nardil, and Parnate °-methylene blue (injected into a vein) °-other medicines that prolong the QT interval (cause an abnormal heart rhythm) °-phenytoin °-rifampicin °-tramadol °This list may not describe all possible interactions. Give your health care provider a list of all the medicines,   herbs, non-prescription drugs, or dietary supplements you use. Also tell them if you smoke, drink alcohol, or use illegal drugs. Some items may interact with your medicine. °What should I watch for while using this medicine? °Check with your doctor or health care professional right away if you have any sign of an allergic reaction. °What side effects may I notice from receiving this medicine? °Side effects that you should report  to your doctor or health care professional as soon as possible: °-allergic reactions like skin rash, itching or hives, swelling of the face, lips or tongue °-breathing problems °-confusion °-dizziness °-fast or irregular heartbeat °-feeling faint or lightheaded, falls °-fever and chills °-loss of balance or coordination °-seizures °-sweating °-swelling of the hands or feet °-tightness in the chest °-tremors °-unusually weak or tired °Side effects that usually do not require medical attention (report to your doctor or health care professional if they continue or are bothersome): °-constipation or diarrhea °-headache °This list may not describe all possible side effects. Call your doctor for medical advice about side effects. You may report side effects to FDA at 1-800-FDA-1088. °Where should I keep my medicine? °Keep out of the reach of children. °Store between 2 and 30 degrees C (36 and 86 degrees F). Throw away any unused medicine after the expiration date. °NOTE: This sheet is a summary. It may not cover all possible information. If you have questions about this medicine, talk to your doctor, pharmacist, or health care provider. °© 2015, [employer]. (2013-03-01 16:27:45) ° °

## 2014-04-02 ENCOUNTER — Ambulatory Visit (INDEPENDENT_AMBULATORY_CARE_PROVIDER_SITE_OTHER): Payer: Medicaid Other | Admitting: Internal Medicine

## 2014-04-04 ENCOUNTER — Telehealth (INDEPENDENT_AMBULATORY_CARE_PROVIDER_SITE_OTHER): Payer: Self-pay | Admitting: *Deleted

## 2014-04-04 ENCOUNTER — Encounter (INDEPENDENT_AMBULATORY_CARE_PROVIDER_SITE_OTHER): Payer: Self-pay | Admitting: *Deleted

## 2014-04-04 NOTE — Telephone Encounter (Signed)
Dimple Caseyebecca NO SHOWED for her apt with Dr. Karilyn Cotaehman on 04/02/14. A NS letter has been mailed.

## 2014-04-04 NOTE — Telephone Encounter (Signed)
Noted  

## 2014-05-24 ENCOUNTER — Encounter (INDEPENDENT_AMBULATORY_CARE_PROVIDER_SITE_OTHER): Payer: Self-pay

## 2015-12-12 ENCOUNTER — Other Ambulatory Visit: Payer: Self-pay | Admitting: Physician Assistant

## 2015-12-12 DIAGNOSIS — Z1231 Encounter for screening mammogram for malignant neoplasm of breast: Secondary | ICD-10-CM

## 2016-01-01 ENCOUNTER — Encounter: Payer: Self-pay | Admitting: Family Medicine

## 2016-01-01 NOTE — Progress Notes (Signed)
Here with her daughter who has strep. She states that she's had some sore throat that started this morning but also a little mild cough. She does not have any tonsils. She does have Keflex tablets at home. Forcefully she does not have insurance and she does not have a primary care provider. Her exam shows some mild erythema but no exudates no significant lymphadenopathy ear canals and membranes are clear nares clear. She may be getting just a viral infection. However her daughter does have strep therefore for sore throat persists and she gets any fever I would recommend her taking the Keflex she can take 1 tablet twice a day for at least 5 days which she believes she has enough at home. She will go to the emergency room something worsens and she is not have insurance and she is not a patient here.

## 2016-02-07 ENCOUNTER — Ambulatory Visit: Payer: Medicaid Other

## 2017-09-01 ENCOUNTER — Other Ambulatory Visit: Payer: Self-pay | Admitting: Physician Assistant

## 2017-09-01 DIAGNOSIS — Z139 Encounter for screening, unspecified: Secondary | ICD-10-CM

## 2017-09-10 ENCOUNTER — Ambulatory Visit
Admission: RE | Admit: 2017-09-10 | Discharge: 2017-09-10 | Disposition: A | Discharge status: Home / Self Care | Payer: Managed Care, Other (non HMO) | Source: Ambulatory Visit | Attending: Physician Assistant | Admitting: Physician Assistant

## 2017-09-10 DIAGNOSIS — Z139 Encounter for screening, unspecified: Secondary | ICD-10-CM

## 2018-10-14 ENCOUNTER — Other Ambulatory Visit: Payer: Self-pay | Admitting: Physician Assistant

## 2018-10-14 DIAGNOSIS — Z1231 Encounter for screening mammogram for malignant neoplasm of breast: Secondary | ICD-10-CM

## 2018-12-07 ENCOUNTER — Ambulatory Visit: Payer: Managed Care, Other (non HMO)

## 2023-05-12 ENCOUNTER — Encounter (HOSPITAL_BASED_OUTPATIENT_CLINIC_OR_DEPARTMENT_OTHER): Payer: Self-pay

## 2023-05-12 ENCOUNTER — Other Ambulatory Visit (HOSPITAL_BASED_OUTPATIENT_CLINIC_OR_DEPARTMENT_OTHER): Payer: Self-pay

## 2023-05-12 MED ORDER — MOUNJARO 2.5 MG/0.5ML ~~LOC~~ SOAJ
2.5000 mg | SUBCUTANEOUS | 0 refills | Status: DC
  Filled 2023-05-12: qty 2, 28d supply, fill #0

## 2023-05-12 MED ORDER — LEVOTHYROXINE SODIUM 125 MCG PO TABS
125.0000 ug | ORAL_TABLET | Freq: Every morning | ORAL | 1 refills | Status: DC
  Filled 2023-05-12 (×2): qty 90, 90d supply, fill #0
  Filled 2023-08-13: qty 90, 90d supply, fill #1

## 2023-05-14 ENCOUNTER — Other Ambulatory Visit (HOSPITAL_BASED_OUTPATIENT_CLINIC_OR_DEPARTMENT_OTHER): Payer: Self-pay

## 2023-06-24 ENCOUNTER — Other Ambulatory Visit (HOSPITAL_BASED_OUTPATIENT_CLINIC_OR_DEPARTMENT_OTHER): Payer: Self-pay

## 2023-06-24 MED ORDER — MOUNJARO 5 MG/0.5ML ~~LOC~~ SOAJ
5.0000 mg | SUBCUTANEOUS | 0 refills | Status: DC
  Filled 2023-06-24: qty 2, 28d supply, fill #0

## 2023-06-25 ENCOUNTER — Other Ambulatory Visit (HOSPITAL_BASED_OUTPATIENT_CLINIC_OR_DEPARTMENT_OTHER): Payer: Self-pay

## 2023-06-25 ENCOUNTER — Other Ambulatory Visit: Payer: Self-pay

## 2023-06-25 MED ORDER — ONDANSETRON HCL 4 MG PO TABS: 4.0000 mg | ORAL_TABLET | Freq: Every day | ORAL | 1 refills | Status: AC | PRN

## 2023-06-25 MED ORDER — PANTOPRAZOLE SODIUM 40 MG PO TBEC
40.0000 mg | DELAYED_RELEASE_TABLET | Freq: Every day | ORAL | 2 refills | Status: AC
  Filled 2023-08-13: qty 90, 90d supply, fill #0
  Filled 2023-11-10: qty 90, 90d supply, fill #1

## 2023-06-25 MED ORDER — PREMARIN 0.3 MG PO TABS
0.3000 mg | ORAL_TABLET | Freq: Every day | ORAL | 2 refills | Status: DC
  Filled 2023-06-25: qty 30, 30d supply, fill #0
  Filled 2023-09-16: qty 30, 30d supply, fill #1
  Filled 2023-10-12 – 2023-12-13 (×2): qty 30, 30d supply, fill #2

## 2023-06-25 MED ORDER — PREMARIN 0.3 MG PO TABS
0.3000 mg | ORAL_TABLET | Freq: Every day | ORAL | 2 refills | Status: AC
  Filled 2023-07-23 – 2023-08-13 (×2): qty 30, 30d supply, fill #0
  Filled 2023-09-16 – 2023-10-12 (×2): qty 30, 30d supply, fill #1
  Filled 2023-11-12: qty 30, 30d supply, fill #2

## 2023-06-25 MED ORDER — LISINOPRIL 10 MG PO TABS
10.0000 mg | ORAL_TABLET | Freq: Every day | ORAL | 1 refills | Status: DC
  Filled 2023-08-13: qty 90, 90d supply, fill #0

## 2023-06-25 MED ORDER — SERTRALINE HCL 100 MG PO TABS
100.0000 mg | ORAL_TABLET | Freq: Every day | ORAL | 0 refills | Status: DC
  Filled 2023-08-13 – 2023-12-13 (×2): qty 90, 90d supply, fill #0

## 2023-07-07 ENCOUNTER — Other Ambulatory Visit (HOSPITAL_BASED_OUTPATIENT_CLINIC_OR_DEPARTMENT_OTHER): Payer: Self-pay

## 2023-07-07 MED ORDER — OSELTAMIVIR PHOSPHATE 75 MG PO CAPS
75.0000 mg | ORAL_CAPSULE | Freq: Every day | ORAL | 0 refills | Status: AC
  Filled 2023-07-07: qty 10, 10d supply, fill #0

## 2023-07-07 MED ORDER — PROMETHAZINE-DM 6.25-15 MG/5ML PO SYRP
5.0000 mL | ORAL_SOLUTION | Freq: Three times a day (TID) | ORAL | 0 refills | Status: DC | PRN
  Filled 2023-07-07: qty 120, 8d supply, fill #0

## 2023-07-07 MED ORDER — BENZONATATE 200 MG PO CAPS
200.0000 mg | ORAL_CAPSULE | Freq: Three times a day (TID) | ORAL | 0 refills | Status: DC | PRN
  Filled 2023-07-07: qty 15, 5d supply, fill #0

## 2023-07-07 MED ORDER — ALBUTEROL SULFATE HFA 108 (90 BASE) MCG/ACT IN AERS
2.0000 | INHALATION_SPRAY | Freq: Four times a day (QID) | RESPIRATORY_TRACT | 0 refills | Status: DC | PRN
  Filled 2023-07-07: qty 18, 25d supply, fill #0

## 2023-07-07 MED ORDER — PAXLOVID (300/100) 20 X 150 MG & 10 X 100MG PO TBPK
ORAL_TABLET | ORAL | 0 refills | Status: AC
  Filled 2023-07-07: qty 30, 5d supply, fill #0

## 2023-07-21 ENCOUNTER — Other Ambulatory Visit (HOSPITAL_BASED_OUTPATIENT_CLINIC_OR_DEPARTMENT_OTHER): Payer: Self-pay

## 2023-07-21 MED ORDER — MOUNJARO 7.5 MG/0.5ML ~~LOC~~ SOAJ
7.5000 mg | SUBCUTANEOUS | 1 refills | Status: AC
Start: 2023-07-21 — End: ?
  Filled 2023-07-21: qty 2, 28d supply, fill #0
  Filled 2023-08-13: qty 2, 28d supply, fill #1

## 2023-07-24 ENCOUNTER — Other Ambulatory Visit (HOSPITAL_BASED_OUTPATIENT_CLINIC_OR_DEPARTMENT_OTHER): Payer: Self-pay

## 2023-08-05 ENCOUNTER — Other Ambulatory Visit (HOSPITAL_BASED_OUTPATIENT_CLINIC_OR_DEPARTMENT_OTHER): Payer: Self-pay

## 2023-08-13 ENCOUNTER — Other Ambulatory Visit (HOSPITAL_BASED_OUTPATIENT_CLINIC_OR_DEPARTMENT_OTHER): Payer: Self-pay

## 2023-08-13 ENCOUNTER — Other Ambulatory Visit: Payer: Self-pay

## 2023-08-14 ENCOUNTER — Other Ambulatory Visit (HOSPITAL_BASED_OUTPATIENT_CLINIC_OR_DEPARTMENT_OTHER): Payer: Self-pay

## 2023-08-17 ENCOUNTER — Other Ambulatory Visit (HOSPITAL_BASED_OUTPATIENT_CLINIC_OR_DEPARTMENT_OTHER): Payer: Self-pay

## 2023-08-17 MED ORDER — LEVOTHYROXINE SODIUM 150 MCG PO TABS
150.0000 ug | ORAL_TABLET | Freq: Every morning | ORAL | 1 refills | Status: DC
  Filled 2023-08-17: qty 90, 90d supply, fill #0
  Filled 2023-09-28: qty 90, 90d supply, fill #1
  Filled 2023-10-18: qty 30, 30d supply, fill #1
  Filled 2023-11-10: qty 30, 30d supply, fill #2
  Filled 2023-12-13: qty 30, 30d supply, fill #3

## 2023-09-09 ENCOUNTER — Other Ambulatory Visit (HOSPITAL_BASED_OUTPATIENT_CLINIC_OR_DEPARTMENT_OTHER): Payer: Self-pay

## 2023-09-09 MED ORDER — ALPRAZOLAM 0.5 MG PO TABS
0.5000 mg | ORAL_TABLET | Freq: Two times a day (BID) | ORAL | 0 refills | Status: AC | PRN
  Filled 2023-09-09: qty 60, 30d supply, fill #0

## 2023-09-10 ENCOUNTER — Other Ambulatory Visit (HOSPITAL_BASED_OUTPATIENT_CLINIC_OR_DEPARTMENT_OTHER): Payer: Self-pay

## 2023-09-10 MED ORDER — MOUNJARO 10 MG/0.5ML ~~LOC~~ SOAJ
10.0000 mg | SUBCUTANEOUS | 2 refills | Status: DC
Start: 2023-09-10 — End: 2023-10-21
  Filled 2023-09-10: qty 2, 28d supply, fill #0
  Filled 2023-10-12 – 2023-10-16 (×3): qty 2, 28d supply, fill #1

## 2023-09-16 ENCOUNTER — Other Ambulatory Visit (HOSPITAL_BASED_OUTPATIENT_CLINIC_OR_DEPARTMENT_OTHER): Payer: Self-pay

## 2023-09-16 ENCOUNTER — Other Ambulatory Visit: Payer: Self-pay

## 2023-09-28 ENCOUNTER — Other Ambulatory Visit (HOSPITAL_BASED_OUTPATIENT_CLINIC_OR_DEPARTMENT_OTHER): Payer: Self-pay

## 2023-10-04 ENCOUNTER — Other Ambulatory Visit (HOSPITAL_BASED_OUTPATIENT_CLINIC_OR_DEPARTMENT_OTHER): Payer: Self-pay

## 2023-10-12 ENCOUNTER — Other Ambulatory Visit (HOSPITAL_BASED_OUTPATIENT_CLINIC_OR_DEPARTMENT_OTHER): Payer: Self-pay

## 2023-10-12 ENCOUNTER — Other Ambulatory Visit: Payer: Self-pay

## 2023-10-16 ENCOUNTER — Other Ambulatory Visit (HOSPITAL_BASED_OUTPATIENT_CLINIC_OR_DEPARTMENT_OTHER): Payer: Self-pay

## 2023-10-18 ENCOUNTER — Other Ambulatory Visit (HOSPITAL_BASED_OUTPATIENT_CLINIC_OR_DEPARTMENT_OTHER): Payer: Self-pay

## 2023-10-18 ENCOUNTER — Other Ambulatory Visit: Payer: Self-pay

## 2023-10-19 ENCOUNTER — Other Ambulatory Visit (HOSPITAL_BASED_OUTPATIENT_CLINIC_OR_DEPARTMENT_OTHER): Payer: Self-pay

## 2023-10-22 ENCOUNTER — Other Ambulatory Visit (HOSPITAL_BASED_OUTPATIENT_CLINIC_OR_DEPARTMENT_OTHER): Payer: Self-pay

## 2023-10-22 MED ORDER — MOUNJARO 12.5 MG/0.5ML ~~LOC~~ SOAJ
12.5000 mg | SUBCUTANEOUS | 2 refills | Status: DC
  Filled 2023-10-22: qty 2, 28d supply, fill #0
  Filled 2023-11-12: qty 2, 28d supply, fill #1

## 2023-11-10 ENCOUNTER — Other Ambulatory Visit: Payer: Self-pay

## 2023-11-10 ENCOUNTER — Other Ambulatory Visit (HOSPITAL_BASED_OUTPATIENT_CLINIC_OR_DEPARTMENT_OTHER): Payer: Self-pay

## 2023-11-10 MED ORDER — LISINOPRIL 10 MG PO TABS
10.0000 mg | ORAL_TABLET | Freq: Every day | ORAL | 1 refills | Status: DC
  Filled 2023-11-10: qty 90, 90d supply, fill #0
  Filled 2024-03-08: qty 90, 90d supply, fill #1

## 2023-11-17 ENCOUNTER — Other Ambulatory Visit (HOSPITAL_BASED_OUTPATIENT_CLINIC_OR_DEPARTMENT_OTHER): Payer: Self-pay

## 2023-11-17 MED ORDER — MOUNJARO 15 MG/0.5ML ~~LOC~~ SOAJ
15.0000 mg | SUBCUTANEOUS | 3 refills | Status: AC
  Filled 2023-11-17 (×2): qty 2, 28d supply, fill #0
  Filled 2023-12-13: qty 2, 28d supply, fill #1
  Filled 2024-01-09: qty 2, 28d supply, fill #2
  Filled 2024-02-07: qty 2, 28d supply, fill #3

## 2023-12-13 ENCOUNTER — Other Ambulatory Visit (HOSPITAL_BASED_OUTPATIENT_CLINIC_OR_DEPARTMENT_OTHER): Payer: Self-pay

## 2023-12-14 ENCOUNTER — Other Ambulatory Visit (HOSPITAL_BASED_OUTPATIENT_CLINIC_OR_DEPARTMENT_OTHER): Payer: Self-pay

## 2023-12-21 ENCOUNTER — Other Ambulatory Visit (HOSPITAL_BASED_OUTPATIENT_CLINIC_OR_DEPARTMENT_OTHER): Payer: Self-pay

## 2023-12-21 MED ORDER — METFORMIN HCL ER 500 MG PO TB24
500.0000 mg | ORAL_TABLET | Freq: Every day | ORAL | 1 refills | Status: AC
  Filled 2023-12-21: qty 90, 90d supply, fill #0

## 2024-01-09 ENCOUNTER — Other Ambulatory Visit (HOSPITAL_BASED_OUTPATIENT_CLINIC_OR_DEPARTMENT_OTHER): Payer: Self-pay

## 2024-01-10 ENCOUNTER — Other Ambulatory Visit (HOSPITAL_BASED_OUTPATIENT_CLINIC_OR_DEPARTMENT_OTHER): Payer: Self-pay

## 2024-01-10 ENCOUNTER — Other Ambulatory Visit: Payer: Self-pay

## 2024-01-10 MED ORDER — SERTRALINE HCL 100 MG PO TABS
100.0000 mg | ORAL_TABLET | Freq: Every day | ORAL | 0 refills | Status: DC
  Filled 2024-03-13: qty 90, 90d supply, fill #0

## 2024-01-10 MED ORDER — PREMARIN 0.3 MG PO TABS
0.3000 mg | ORAL_TABLET | Freq: Every day | ORAL | 2 refills | Status: AC
  Filled 2024-01-10: qty 30, 30d supply, fill #0
  Filled 2024-05-18: qty 30, 30d supply, fill #1
  Filled 2024-06-17: qty 30, 30d supply, fill #2

## 2024-01-12 ENCOUNTER — Other Ambulatory Visit (HOSPITAL_BASED_OUTPATIENT_CLINIC_OR_DEPARTMENT_OTHER): Payer: Self-pay

## 2024-01-18 ENCOUNTER — Other Ambulatory Visit (HOSPITAL_BASED_OUTPATIENT_CLINIC_OR_DEPARTMENT_OTHER): Payer: Self-pay

## 2024-01-18 MED ORDER — LEVOTHYROXINE SODIUM 150 MCG PO TABS
ORAL_TABLET | ORAL | 1 refills | Status: DC
  Filled 2024-01-18: qty 90, 90d supply, fill #0

## 2024-01-19 ENCOUNTER — Other Ambulatory Visit (HOSPITAL_BASED_OUTPATIENT_CLINIC_OR_DEPARTMENT_OTHER): Payer: Self-pay

## 2024-01-25 ENCOUNTER — Other Ambulatory Visit (HOSPITAL_BASED_OUTPATIENT_CLINIC_OR_DEPARTMENT_OTHER): Payer: Self-pay

## 2024-01-25 ENCOUNTER — Other Ambulatory Visit: Payer: Self-pay

## 2024-01-25 MED ORDER — PREMARIN 0.3 MG PO TABS
0.3000 mg | ORAL_TABLET | Freq: Every day | ORAL | 1 refills | Status: AC
  Filled 2024-01-25 – 2024-02-07 (×2): qty 90, 90d supply, fill #0

## 2024-01-25 MED ORDER — LEVOTHYROXINE SODIUM 125 MCG PO TABS
125.0000 ug | ORAL_TABLET | Freq: Every morning | ORAL | 2 refills | Status: DC
  Filled 2024-01-25: qty 90, 90d supply, fill #0

## 2024-02-07 ENCOUNTER — Other Ambulatory Visit (HOSPITAL_BASED_OUTPATIENT_CLINIC_OR_DEPARTMENT_OTHER): Payer: Self-pay

## 2024-02-07 ENCOUNTER — Other Ambulatory Visit: Payer: Self-pay

## 2024-02-07 MED ORDER — MOUNJARO 15 MG/0.5ML ~~LOC~~ SOAJ
15.0000 mg | SUBCUTANEOUS | 3 refills | Status: DC
  Filled 2024-02-07 – 2024-03-08 (×2): qty 2, 28d supply, fill #0
  Filled 2024-04-04: qty 2, 28d supply, fill #1
  Filled 2024-04-29: qty 2, 28d supply, fill #2
  Filled 2024-06-02: qty 2, 28d supply, fill #3

## 2024-02-08 ENCOUNTER — Other Ambulatory Visit (HOSPITAL_BASED_OUTPATIENT_CLINIC_OR_DEPARTMENT_OTHER): Payer: Self-pay

## 2024-03-08 ENCOUNTER — Other Ambulatory Visit: Payer: Self-pay

## 2024-03-08 ENCOUNTER — Other Ambulatory Visit (HOSPITAL_BASED_OUTPATIENT_CLINIC_OR_DEPARTMENT_OTHER): Payer: Self-pay

## 2024-03-08 MED ORDER — LEVOTHYROXINE SODIUM 112 MCG PO TABS
112.0000 ug | ORAL_TABLET | Freq: Every morning | ORAL | 0 refills | Status: DC
  Filled 2024-03-08: qty 90, 90d supply, fill #0

## 2024-03-13 ENCOUNTER — Other Ambulatory Visit (HOSPITAL_BASED_OUTPATIENT_CLINIC_OR_DEPARTMENT_OTHER): Payer: Self-pay

## 2024-04-18 ENCOUNTER — Other Ambulatory Visit (HOSPITAL_BASED_OUTPATIENT_CLINIC_OR_DEPARTMENT_OTHER): Payer: Self-pay

## 2024-04-18 MED ORDER — SULFAMETHOXAZOLE-TRIMETHOPRIM 800-160 MG PO TABS
1.0000 | ORAL_TABLET | Freq: Two times a day (BID) | ORAL | 0 refills | Status: AC
  Filled 2024-04-18: qty 20, 10d supply, fill #0

## 2024-04-18 MED ORDER — FLUCONAZOLE 150 MG PO TABS
ORAL_TABLET | ORAL | 1 refills | Status: AC
  Filled 2024-04-18: qty 3, 3d supply, fill #0
  Filled 2024-04-29: qty 3, 3d supply, fill #1

## 2024-04-27 ENCOUNTER — Other Ambulatory Visit (HOSPITAL_BASED_OUTPATIENT_CLINIC_OR_DEPARTMENT_OTHER): Payer: Self-pay

## 2024-04-27 MED ORDER — CEPHALEXIN 500 MG PO CAPS
500.0000 mg | ORAL_CAPSULE | Freq: Three times a day (TID) | ORAL | 0 refills | Status: AC
  Filled 2024-04-27: qty 21, 7d supply, fill #0

## 2024-04-28 ENCOUNTER — Other Ambulatory Visit (HOSPITAL_BASED_OUTPATIENT_CLINIC_OR_DEPARTMENT_OTHER): Payer: Self-pay

## 2024-05-03 ENCOUNTER — Other Ambulatory Visit (HOSPITAL_BASED_OUTPATIENT_CLINIC_OR_DEPARTMENT_OTHER): Payer: Self-pay

## 2024-05-03 MED ORDER — MUPIROCIN 2 % EX OINT
TOPICAL_OINTMENT | CUTANEOUS | 0 refills | Status: AC
  Filled 2024-05-03: qty 22, 14d supply, fill #0
  Filled 2024-05-03: qty 22, 30d supply, fill #0

## 2024-05-18 ENCOUNTER — Other Ambulatory Visit: Payer: Self-pay

## 2024-05-18 ENCOUNTER — Other Ambulatory Visit (HOSPITAL_BASED_OUTPATIENT_CLINIC_OR_DEPARTMENT_OTHER): Payer: Self-pay

## 2024-05-18 MED ORDER — LISINOPRIL 10 MG PO TABS
10.0000 mg | ORAL_TABLET | Freq: Every day | ORAL | 1 refills | Status: AC
  Filled 2024-05-18: qty 90, 90d supply, fill #0

## 2024-05-18 MED ORDER — LEVOTHYROXINE SODIUM 112 MCG PO TABS
ORAL_TABLET | ORAL | 0 refills | Status: AC
  Filled 2024-05-18: qty 90, 90d supply, fill #0

## 2024-06-07 ENCOUNTER — Other Ambulatory Visit (HOSPITAL_BASED_OUTPATIENT_CLINIC_OR_DEPARTMENT_OTHER): Payer: Self-pay

## 2024-06-17 ENCOUNTER — Other Ambulatory Visit (HOSPITAL_BASED_OUTPATIENT_CLINIC_OR_DEPARTMENT_OTHER): Payer: Self-pay

## 2024-06-18 ENCOUNTER — Other Ambulatory Visit: Payer: Self-pay

## 2024-06-19 ENCOUNTER — Other Ambulatory Visit (HOSPITAL_BASED_OUTPATIENT_CLINIC_OR_DEPARTMENT_OTHER): Payer: Self-pay

## 2024-06-19 MED ORDER — SERTRALINE HCL 100 MG PO TABS
100.0000 mg | ORAL_TABLET | Freq: Every day | ORAL | 0 refills | Status: AC
  Filled 2024-06-19: qty 90, 90d supply, fill #0

## 2024-06-19 MED ORDER — ALPRAZOLAM 0.5 MG PO TABS
0.5000 mg | ORAL_TABLET | Freq: Two times a day (BID) | ORAL | 0 refills | Status: AC
  Filled 2024-06-19: qty 60, 30d supply, fill #0

## 2024-06-30 ENCOUNTER — Other Ambulatory Visit (HOSPITAL_BASED_OUTPATIENT_CLINIC_OR_DEPARTMENT_OTHER): Payer: Self-pay

## 2024-06-30 MED ORDER — MOUNJARO 15 MG/0.5ML ~~LOC~~ SOAJ
0.5000 mL | SUBCUTANEOUS | 3 refills | Status: AC
  Filled 2024-06-30: qty 2, 28d supply, fill #0
